# Patient Record
Sex: Male | Born: 1965 | Race: White | Hispanic: No | Marital: Married | Smoking: Never smoker
Health system: Southern US, Community
[De-identification: ages and names within clinical notes are randomized; demographics above are authoritative.]

## PROBLEM LIST (undated history)

## (undated) DIAGNOSIS — M109 Gout, unspecified: Secondary | ICD-10-CM

## (undated) DIAGNOSIS — Z87442 Personal history of urinary calculi: Secondary | ICD-10-CM

## (undated) DIAGNOSIS — E119 Type 2 diabetes mellitus without complications: Secondary | ICD-10-CM

## (undated) DIAGNOSIS — I10 Essential (primary) hypertension: Secondary | ICD-10-CM

## (undated) HISTORY — PX: LAMINECTOMY: SHX219

## (undated) HISTORY — PX: BACK SURGERY: SHX140

---

## 2001-02-17 ENCOUNTER — Encounter: Payer: Self-pay | Admitting: Internal Medicine

## 2001-02-17 ENCOUNTER — Ambulatory Visit (HOSPITAL_COMMUNITY): Admission: RE | Admit: 2001-02-17 | Discharge: 2001-02-17 | Payer: Self-pay | Admitting: Internal Medicine

## 2001-10-09 ENCOUNTER — Ambulatory Visit (HOSPITAL_COMMUNITY): Admission: RE | Admit: 2001-10-09 | Discharge: 2001-10-09 | Payer: Self-pay | Admitting: Internal Medicine

## 2001-10-09 ENCOUNTER — Encounter: Payer: Self-pay | Admitting: Internal Medicine

## 2001-10-20 ENCOUNTER — Encounter: Payer: Self-pay | Admitting: Neurosurgery

## 2001-10-23 ENCOUNTER — Encounter: Payer: Self-pay | Admitting: Neurosurgery

## 2001-10-23 ENCOUNTER — Ambulatory Visit (HOSPITAL_COMMUNITY): Admission: RE | Admit: 2001-10-23 | Discharge: 2001-10-23 | Payer: Self-pay | Admitting: Neurosurgery

## 2002-02-08 ENCOUNTER — Encounter: Admission: RE | Admit: 2002-02-08 | Discharge: 2002-05-09 | Payer: Self-pay | Admitting: Internal Medicine

## 2012-02-05 ENCOUNTER — Inpatient Hospital Stay (HOSPITAL_COMMUNITY)
Admission: EM | Admit: 2012-02-05 | Discharge: 2012-02-08 | DRG: 219 | Disposition: A | Discharge status: Home / Self Care | Payer: BC Managed Care – PPO | Attending: Orthopedic Surgery | Admitting: Orthopedic Surgery

## 2012-02-05 ENCOUNTER — Emergency Department (HOSPITAL_COMMUNITY): Payer: BC Managed Care – PPO

## 2012-02-05 ENCOUNTER — Encounter (HOSPITAL_COMMUNITY): Payer: Self-pay | Admitting: *Deleted

## 2012-02-05 DIAGNOSIS — Y92009 Unspecified place in unspecified non-institutional (private) residence as the place of occurrence of the external cause: Secondary | ICD-10-CM

## 2012-02-05 DIAGNOSIS — X500XXA Overexertion from strenuous movement or load, initial encounter: Secondary | ICD-10-CM | POA: Diagnosis present

## 2012-02-05 DIAGNOSIS — M109 Gout, unspecified: Secondary | ICD-10-CM | POA: Diagnosis present

## 2012-02-05 DIAGNOSIS — E119 Type 2 diabetes mellitus without complications: Secondary | ICD-10-CM | POA: Diagnosis present

## 2012-02-05 DIAGNOSIS — Y998 Other external cause status: Secondary | ICD-10-CM

## 2012-02-05 DIAGNOSIS — F17200 Nicotine dependence, unspecified, uncomplicated: Secondary | ICD-10-CM | POA: Diagnosis present

## 2012-02-05 DIAGNOSIS — S82853A Displaced trimalleolar fracture of unspecified lower leg, initial encounter for closed fracture: Principal | ICD-10-CM | POA: Diagnosis present

## 2012-02-05 HISTORY — DX: Type 2 diabetes mellitus without complications: E11.9

## 2012-02-05 HISTORY — DX: Gout, unspecified: M10.9

## 2012-02-05 LAB — CBC WITH DIFFERENTIAL/PLATELET
Basophils Absolute: 0.1 10*3/uL (ref 0.0–0.1)
Basophils Relative: 0 % (ref 0–1)
Eosinophils Absolute: 0.1 10*3/uL (ref 0.0–0.7)
MCH: 30.6 pg (ref 26.0–34.0)
MCHC: 35 g/dL (ref 30.0–36.0)
Neutro Abs: 11.6 10*3/uL — ABNORMAL HIGH (ref 1.7–7.7)
Neutrophils Relative %: 78 % — ABNORMAL HIGH (ref 43–77)
RDW: 13.5 % (ref 11.5–15.5)

## 2012-02-05 LAB — GLUCOSE, CAPILLARY
Glucose-Capillary: 113 mg/dL — ABNORMAL HIGH (ref 70–99)
Glucose-Capillary: 137 mg/dL — ABNORMAL HIGH (ref 70–99)

## 2012-02-05 LAB — BASIC METABOLIC PANEL
Chloride: 99 mEq/L (ref 96–112)
Creatinine, Ser: 0.96 mg/dL (ref 0.50–1.35)
GFR calc Af Amer: >90 mL/min (ref >90)
GFR calc non Af Amer: >90 mL/min (ref >90)
Potassium: 3.9 mEq/L (ref 3.5–5.1)

## 2012-02-05 LAB — PROTIME-INR: Prothrombin Time: 13.3 seconds (ref 11.6–15.2)

## 2012-02-05 MED ORDER — OXYCODONE-ACETAMINOPHEN 5-325 MG PO TABS
1.0000 | ORAL_TABLET | Freq: Once | ORAL | Status: AC
  Administered 2012-02-05: 1 via ORAL
  Filled 2012-02-05: qty 1

## 2012-02-05 MED ORDER — FENTANYL CITRATE 0.05 MG/ML IJ SOLN
50.0000 ug | Freq: Once | INTRAMUSCULAR | Status: DC
  Filled 2012-02-05: qty 2

## 2012-02-05 MED ORDER — HYDROCODONE-ACETAMINOPHEN 5-325 MG PO TABS
1.0000 | ORAL_TABLET | ORAL | Status: DC | PRN
  Administered 2012-02-07 – 2012-02-08 (×5): 1 via ORAL
  Filled 2012-02-05 (×5): qty 1

## 2012-02-05 MED ORDER — OXYCODONE-ACETAMINOPHEN 5-325 MG PO TABS
1.0000 | ORAL_TABLET | ORAL | Status: DC | PRN
  Administered 2012-02-06 – 2012-02-07 (×3): 1 via ORAL
  Filled 2012-02-05 (×4): qty 1

## 2012-02-05 MED ORDER — CEFAZOLIN SODIUM-DEXTROSE 2-3 GM-% IV SOLR
2.0000 g | Freq: Once | INTRAVENOUS | Status: DC
  Filled 2012-02-05: qty 50

## 2012-02-05 MED ORDER — HYDROMORPHONE HCL PF 1 MG/ML IJ SOLN
0.5000 mg | INTRAMUSCULAR | Status: DC | PRN
  Administered 2012-02-05 – 2012-02-08 (×8): 1 mg via INTRAVENOUS
  Filled 2012-02-05 (×8): qty 1

## 2012-02-05 MED ORDER — SODIUM CHLORIDE 0.9 % IV SOLN
INTRAVENOUS | Status: DC
  Administered 2012-02-05: 20:00:00 via INTRAVENOUS
  Administered 2012-02-06 (×2): 75 mL/h via INTRAVENOUS

## 2012-02-05 MED ORDER — SODIUM CHLORIDE 0.9 % IV SOLN: INTRAVENOUS | Status: DC

## 2012-02-05 MED ORDER — ONDANSETRON HCL 4 MG/2ML IJ SOLN: 4.0000 mg | Freq: Four times a day (QID) | INTRAMUSCULAR | Status: DC | PRN

## 2012-02-05 MED ORDER — CHLORHEXIDINE GLUCONATE 4 % EX LIQD
60.0000 mL | Freq: Once | CUTANEOUS | Status: DC
  Filled 2012-02-05 (×2): qty 15

## 2012-02-05 NOTE — ED Notes (Signed)
Pt missed last step Pt states he heard a crunch when injury occurred. Swelling to right medical ankle.

## 2012-02-05 NOTE — H&P (Signed)
Matthew Cook is an 46 y.o. male.   Chief Complaint: Right ankle trimalleolar fracture HPI: 46 year old male was walking into his home to go to the swimming pool and he fell and slipped and broke his ankle. He complains of minimal nonradiating right ankle pain which started today after the fall. Mild deformity noted. X-rays were done in the ER. Splint was applied. He is a tobacco farmer.  Past Medical History  Diagnosis Date  . Gout   . Diabetes mellitus     Past Surgical History  Procedure Date  . Back surgery   . Laminectomy     History reviewed. No pertinent family history. Social History:  reports that he has never smoked. His smokeless tobacco use includes Chew. He reports that he does not drink alcohol or use illicit drugs.  Allergies:  Allergies  Allergen Reactions  . Ciprofloxacin     Medications Prior to Admission  Medication Sig Dispense Refill  . allopurinol (ZYLOPRIM) 300 MG tablet Take 300 mg by mouth daily.        Results for orders placed during the hospital encounter of 02/05/12 (from the past 48 hour(s))  CBC WITH DIFFERENTIAL     Status: Abnormal   Collection Time   02/05/12  3:40 PM      Component Value Range Comment   WBC 14.9 (*) 4.0 - 10.5 K/uL    RBC 5.62  4.22 - 5.81 MIL/uL    Hemoglobin 17.2 (*) 13.0 - 17.0 g/dL    HCT 10.2  72.5 - 36.6 %    MCV 87.4  78.0 - 100.0 fL    MCH 30.6  26.0 - 34.0 pg    MCHC 35.0  30.0 - 36.0 g/dL    RDW 44.0  34.7 - 42.5 %    Platelets 200  150 - 400 K/uL    Neutrophils Relative 78 (*) 43 - 77 %    Neutro Abs 11.6 (*) 1.7 - 7.7 K/uL    Lymphocytes Relative 14  12 - 46 %    Lymphs Abs 2.1  0.7 - 4.0 K/uL    Monocytes Relative 7  3 - 12 %    Monocytes Absolute 1.0  0.1 - 1.0 K/uL    Eosinophils Relative 1  0 - 5 %    Eosinophils Absolute 0.1  0.0 - 0.7 K/uL    Basophils Relative 0  0 - 1 %    Basophils Absolute 0.1  0.0 - 0.1 K/uL   BASIC METABOLIC PANEL     Status: Abnormal   Collection Time   02/05/12  3:40 PM        Component Value Range Comment   Sodium 136  135 - 145 mEq/L    Potassium 3.9  3.5 - 5.1 mEq/L    Chloride 99  96 - 112 mEq/L    CO2 28  19 - 32 mEq/L    Glucose, Bld 113 (*) 70 - 99 mg/dL    BUN 14  6 - 23 mg/dL    Creatinine, Ser 9.56  0.50 - 1.35 mg/dL    Calcium 9.6  8.4 - 38.7 mg/dL    GFR calc non Af Amer >90  >90 mL/min    GFR calc Af Amer >90  >90 mL/min   PROTIME-INR     Status: Normal   Collection Time   02/05/12  3:40 PM      Component Value Range Comment   Prothrombin Time 13.3  11.6 - 15.2 seconds  INR 0.99  0.00 - 1.49   GLUCOSE, CAPILLARY     Status: Abnormal   Collection Time   02/05/12  6:07 PM      Component Value Range Comment   Glucose-Capillary 137 (*) 70 - 99 mg/dL    Dg Ankle Complete Right  02/05/2012  *RADIOLOGY REPORT*  Clinical Data: Larey Seat and injured right ankle.  RIGHT ANKLE - COMPLETE 3+ VIEW  Comparison: None.  Findings: Comminuted oblique fracture involving the distal fibula. Avulsion fragments adjacent to the tip of the lateral malleolus. Transverse fracture involving the medial malleolus.  Lateral subluxation of the talus relative to the tibia.  Mildly displaced posterior malleolar fracture involving the distal tibia.  Large joint effusion/hemarthrosis.  Diffuse soft tissue swelling.  IMPRESSION: Trimalleolar fracture with slight lateral subluxation of the talus relative to the tibia.  Original Report Authenticated By: Arnell Sieving, M.D.    Review of Systems  All other systems reviewed and are negative.    Blood pressure 150/82, pulse 77, temperature 97.7 F (36.5 C), temperature source Oral, resp. rate 18, height 6\' 3"  (1.905 m), weight 106.5 kg (234 lb 12.6 oz), SpO2 97.00%. Physical Exam  Musculoskeletal:       Vital signs are stable as recorded  General appearance is normal  The patient is alert and oriented x3  The patient's mood and affect are normal  Gait assessment: can not walk  The cardiovascular exam reveals normal  pulses and temperature without edema swelling. The lymphatic system is negative for palpable lymph nodes  The sensory exam is normal.  There are no pathologic reflexes.  Balance: could not assess  Exam of the right ankle splinted toes color normal  Seems to have normal feeling to soft touch  Upper extremity exam  Inspection and palpation revealed no abnormalities in the upper extremities.  Range of motion is full without contracture.  Motor exam is normal with grade 5 strength.  The joints are fully reduced without subluxation.  There is no atrophy or tremor and muscle tone is normal.  All joints are stable.   Left Lower extremity exam  Inspection and palpation revealed no tenderness or abnormality in alignment in the lower extremities. Range of motion is full.  Strength is grade 5.   all joints are stable.       radiographs show a trimalleolar right ankle fracture with ankle mortise displacement.  Assessment/Plan Closed right trimalleolar ankle fracture and a diet controlled diabetic active male who is a tobacco farmer  The plan is for open treatment internal fixation with lateral plate and screws and medial fixation as needed. The posterior malleolar fragment is less than 20% of the joint surface and does not require fixation  The patient has been made aware that he must be compliant. He is at high risk for infection and he is at risk for amputation based on his diabetes. He will be nonweightbearing for 12 weeks the first 6 and a cast the next 6 in a brace at 6 weeks we can start active range of motion exercises  Surgery we scheduled for Sunday morning.  Fuller Canada 02/05/2012, 7:42 PM

## 2012-02-05 NOTE — ED Provider Notes (Signed)
History     CSN: [id number]  Arrival date & time 02/05/12  1404   First MD Initiated Contact with Patient 02/05/12 1440      Chief Complaint  Patient presents with  . Ankle Pain     Patient is a 46 y.o. male presenting with ankle pain. The history is provided by the patient.  Ankle Pain  Incident onset: just prior to arrival. The incident occurred at home. Injury mechanism: twisting injury. Pain location: right ankle. The pain is moderate. The pain has been constant since onset. Associated symptoms include inability to bear weight. The symptoms are aggravated by bearing weight and palpation. He has tried rest for the symptoms. The treatment provided mild relief.  pt reports missing a step and twisting ankle No other injury reported.  Did not hit head.  No cp/headache/neck/back pain   Past Medical History  Diagnosis Date  . Gout   . Diabetes mellitus     Past Surgical History  Procedure Date  . Back surgery   . Laminectomy     History reviewed. No pertinent family history.  History  Substance Use Topics  . Smoking status: Never Smoker   . Smokeless tobacco: Not on file  . Alcohol Use: No      Review of Systems  Constitutional: Negative for fever.  Respiratory: Negative for shortness of breath.   Cardiovascular: Negative for chest pain.  Gastrointestinal: Negative for abdominal pain.  Musculoskeletal: Positive for joint swelling. Negative for back pain.  Neurological: Negative for weakness.  All other systems reviewed and are negative.    Allergies  Ciprofloxacin  Home Medications  No current outpatient prescriptions on file.  BP 141/90  Pulse 83  Temp 98.5 F (36.9 C) (Oral)  Resp 16  SpO2 98%  Physical Exam CONSTITUTIONAL: Well developed/well nourished HEAD AND FACE: Normocephalic/atraumatic EYES: EOMI/PERRL ENMT: Mucous membranes moist NECK: supple no meningeal signs SPINE:entire spine nontender CV: S1/S2 noted, no murmurs/rubs/gallops  noted LUNGS: Lungs are clear to auscultation bilaterally, no apparent distress ABDOMEN: soft, nontender, no rebound or guarding GU:no cva tenderness NEURO: Pt is awake/alert, moves all extremitiesx4 EXTREMITIES: pulses normal/equal distally Tenderness/edema/bruising noted over right malleolar surface.  No lacerations noted.  No right prox fib tenderness.  Distal pulses intact on right foot.   All other extremities/joints palpated/ranged and nontender SKIN: warm, color normal PSYCH: no abnormalities of mood noted  ED Course  Procedures   Labs Reviewed - No data to display Dg Ankle Complete Right  02/05/2012  *RADIOLOGY REPORT*  Clinical Data: Larey Seat and injured right ankle.  RIGHT ANKLE - COMPLETE 3+ VIEW  Comparison: None.  Findings: Comminuted oblique fracture involving the distal fibula. Avulsion fragments adjacent to the tip of the lateral malleolus. Transverse fracture involving the medial malleolus.  Lateral subluxation of the talus relative to the tibia.  Mildly displaced posterior malleolar fracture involving the distal tibia.  Large joint effusion/hemarthrosis.  Diffuse soft tissue swelling.  IMPRESSION: Trimalleolar fracture with slight lateral subluxation of the talus relative to the tibia.  Original Report Authenticated By: Arnell Sieving, M.D.     1. Trimalleolar fracture       MDM  Nursing notes including past medical history and social history reviewed and considered in documentation xrays reviewed and considered  Pt with closed trimalleolar fx.  Otherwise well, stable at this time Splint ordered D/w dr Romeo Apple, will admit to hospital Pt agreeable        Joya Gaskins, MD 02/05/12 [phone]

## 2012-02-05 NOTE — ED Notes (Signed)
Floor unable to take report at this time.

## 2012-02-06 ENCOUNTER — Inpatient Hospital Stay (HOSPITAL_COMMUNITY): Payer: BC Managed Care – PPO

## 2012-02-06 ENCOUNTER — Encounter (HOSPITAL_COMMUNITY)
Admission: EM | Disposition: A | Discharge status: Home / Self Care | Payer: Self-pay | Source: Home / Self Care | Attending: Orthopedic Surgery

## 2012-02-06 ENCOUNTER — Inpatient Hospital Stay (HOSPITAL_COMMUNITY): Payer: BC Managed Care – PPO | Admitting: Anesthesiology

## 2012-02-06 ENCOUNTER — Encounter (HOSPITAL_COMMUNITY): Payer: Self-pay | Admitting: Anesthesiology

## 2012-02-06 DIAGNOSIS — S82853A Displaced trimalleolar fracture of unspecified lower leg, initial encounter for closed fracture: Principal | ICD-10-CM

## 2012-02-06 HISTORY — PX: ORIF ANKLE FRACTURE: SHX5408

## 2012-02-06 LAB — GLUCOSE, CAPILLARY
Glucose-Capillary: 98 mg/dL (ref 70–99)
Glucose-Capillary: 75 mg/dL (ref 70–99)
Glucose-Capillary: 156 mg/dL — ABNORMAL HIGH (ref 70–99)

## 2012-02-06 SURGERY — OPEN REDUCTION INTERNAL FIXATION (ORIF) ANKLE FRACTURE
Anesthesia: General | Site: Ankle | Laterality: Right | Wound class: Clean

## 2012-02-06 MED ORDER — LIDOCAINE HCL (PF) 1 % IJ SOLN
INTRAMUSCULAR | Status: AC
  Filled 2012-02-06: qty 5

## 2012-02-06 MED ORDER — GLYCOPYRROLATE 0.2 MG/ML IJ SOLN
INTRAMUSCULAR | Status: AC
  Filled 2012-02-06: qty 1

## 2012-02-06 MED ORDER — LIDOCAINE HCL 1 % IJ SOLN
INTRAMUSCULAR | Status: DC | PRN
  Administered 2012-02-06: 50 mg via INTRADERMAL

## 2012-02-06 MED ORDER — BUPIVACAINE-EPINEPHRINE PF 0.5-1:200000 % IJ SOLN
INTRAMUSCULAR | Status: AC
  Filled 2012-02-06: qty 20

## 2012-02-06 MED ORDER — PHENOL 1.4 % MT LIQD: 1.0000 | OROMUCOSAL | Status: DC | PRN

## 2012-02-06 MED ORDER — FENTANYL CITRATE 0.05 MG/ML IJ SOLN
INTRAMUSCULAR | Status: AC
  Filled 2012-02-06: qty 5

## 2012-02-06 MED ORDER — ALUM & MAG HYDROXIDE-SIMETH 200-200-20 MG/5ML PO SUSP: 30.0000 mL | ORAL | Status: DC | PRN

## 2012-02-06 MED ORDER — ACETAMINOPHEN 10 MG/ML IV SOLN
1000.0000 mg | Freq: Once | INTRAVENOUS | Status: AC
  Administered 2012-02-06: 1000 mg via INTRAVENOUS

## 2012-02-06 MED ORDER — METHOCARBAMOL 100 MG/ML IJ SOLN
500.0000 mg | Freq: Once | INTRAVENOUS | Status: AC
  Administered 2012-02-06: 500 mg via INTRAVENOUS
  Filled 2012-02-06: qty 5

## 2012-02-06 MED ORDER — ONDANSETRON HCL 4 MG/2ML IJ SOLN: 4.0000 mg | Freq: Four times a day (QID) | INTRAMUSCULAR | Status: DC | PRN

## 2012-02-06 MED ORDER — CEFAZOLIN SODIUM-DEXTROSE 2-3 GM-% IV SOLR
2.0000 g | Freq: Four times a day (QID) | INTRAVENOUS | Status: AC
  Administered 2012-02-06 (×2): 2 g via INTRAVENOUS
  Filled 2012-02-06 (×2): qty 50

## 2012-02-06 MED ORDER — PROPOFOL 10 MG/ML IV BOLUS
INTRAVENOUS | Status: DC | PRN
  Administered 2012-02-06: 160 mg via INTRAVENOUS

## 2012-02-06 MED ORDER — CEFAZOLIN SODIUM-DEXTROSE 2-3 GM-% IV SOLR: 2.0000 g | INTRAVENOUS | Status: DC

## 2012-02-06 MED ORDER — ONDANSETRON HCL 4 MG/2ML IJ SOLN
INTRAMUSCULAR | Status: AC
  Administered 2012-02-06: 4 mg via INTRAVENOUS
  Filled 2012-02-06: qty 2

## 2012-02-06 MED ORDER — FENTANYL CITRATE 0.05 MG/ML IJ SOLN
INTRAMUSCULAR | Status: DC | PRN
  Administered 2012-02-06 (×2): 50 ug via INTRAVENOUS
  Administered 2012-02-06: 100 ug via INTRAVENOUS
  Administered 2012-02-06: 150 ug via INTRAVENOUS

## 2012-02-06 MED ORDER — GLYCOPYRROLATE 0.2 MG/ML IJ SOLN
INTRAMUSCULAR | Status: DC | PRN
  Administered 2012-02-06: 0.4 mg via INTRAVENOUS
  Administered 2012-02-06: 0.2 mg via INTRAVENOUS

## 2012-02-06 MED ORDER — METOCLOPRAMIDE HCL 5 MG/ML IJ SOLN: 5.0000 mg | Freq: Three times a day (TID) | INTRAMUSCULAR | Status: DC | PRN

## 2012-02-06 MED ORDER — MIDAZOLAM HCL 2 MG/2ML IJ SOLN
INTRAMUSCULAR | Status: AC
  Filled 2012-02-06: qty 4

## 2012-02-06 MED ORDER — ONDANSETRON HCL 4 MG PO TABS: 4.0000 mg | ORAL_TABLET | Freq: Four times a day (QID) | ORAL | Status: DC | PRN

## 2012-02-06 MED ORDER — OXYCODONE HCL 5 MG PO TABS
5.0000 mg | ORAL_TABLET | Freq: Once | ORAL | Status: AC
  Administered 2012-02-06: 5 mg via ORAL

## 2012-02-06 MED ORDER — SODIUM CHLORIDE 0.9 % IV SOLN
INTRAVENOUS | Status: DC
  Administered 2012-02-06: 75 mL/h via INTRAVENOUS
  Administered 2012-02-07: 22:00:00 via INTRAVENOUS

## 2012-02-06 MED ORDER — LACTATED RINGERS IV SOLN
INTRAVENOUS | Status: DC | PRN
  Administered 2012-02-06: 12:00:00 via INTRAVENOUS

## 2012-02-06 MED ORDER — CEFAZOLIN SODIUM-DEXTROSE 2-3 GM-% IV SOLR
INTRAVENOUS | Status: AC
  Administered 2012-02-06: 2 g via INTRAVENOUS
  Filled 2012-02-06: qty 50

## 2012-02-06 MED ORDER — OXYCODONE HCL 5 MG PO TABS
ORAL_TABLET | ORAL | Status: AC
  Administered 2012-02-06: 5 mg via ORAL
  Filled 2012-02-06: qty 1

## 2012-02-06 MED ORDER — PROPOFOL 10 MG/ML IV EMUL
INTRAVENOUS | Status: AC
  Filled 2012-02-06: qty 20

## 2012-02-06 MED ORDER — ALLOPURINOL 300 MG PO TABS
300.0000 mg | ORAL_TABLET | Freq: Every day | ORAL | Status: DC
  Administered 2012-02-06 – 2012-02-08 (×3): 300 mg via ORAL
  Filled 2012-02-06 (×3): qty 1

## 2012-02-06 MED ORDER — CELECOXIB 100 MG PO CAPS
400.0000 mg | ORAL_CAPSULE | Freq: Once | ORAL | Status: AC
  Administered 2012-02-06: 400 mg via ORAL

## 2012-02-06 MED ORDER — ONDANSETRON HCL 4 MG/2ML IJ SOLN
INTRAMUSCULAR | Status: DC | PRN
  Administered 2012-02-06: 4 mg via INTRAVENOUS

## 2012-02-06 MED ORDER — NEOSTIGMINE METHYLSULFATE 1 MG/ML IJ SOLN
INTRAMUSCULAR | Status: DC | PRN
  Administered 2012-02-06: 3 mg via INTRAVENOUS

## 2012-02-06 MED ORDER — ROCURONIUM BROMIDE 50 MG/5ML IV SOLN
INTRAVENOUS | Status: AC
  Filled 2012-02-06: qty 1

## 2012-02-06 MED ORDER — MIDAZOLAM HCL 5 MG/5ML IJ SOLN
INTRAMUSCULAR | Status: DC | PRN
  Administered 2012-02-06 (×2): 2 mg via INTRAVENOUS

## 2012-02-06 MED ORDER — ROCURONIUM BROMIDE 100 MG/10ML IV SOLN
INTRAVENOUS | Status: DC | PRN
  Administered 2012-02-06: 40 mg via INTRAVENOUS
  Administered 2012-02-06: 10 mg via INTRAVENOUS

## 2012-02-06 MED ORDER — ONDANSETRON HCL 4 MG/2ML IJ SOLN
INTRAMUSCULAR | Status: AC
  Filled 2012-02-06: qty 2

## 2012-02-06 MED ORDER — ONDANSETRON HCL 4 MG/2ML IJ SOLN
4.0000 mg | Freq: Once | INTRAMUSCULAR | Status: AC
  Administered 2012-02-06: 4 mg via INTRAVENOUS

## 2012-02-06 MED ORDER — ENOXAPARIN SODIUM 30 MG/0.3ML ~~LOC~~ SOLN
30.0000 mg | Freq: Two times a day (BID) | SUBCUTANEOUS | Status: DC
  Administered 2012-02-07 – 2012-02-08 (×3): 30 mg via SUBCUTANEOUS
  Filled 2012-02-06 (×3): qty 0.3

## 2012-02-06 MED ORDER — METOCLOPRAMIDE HCL 10 MG PO TABS: 5.0000 mg | ORAL_TABLET | Freq: Three times a day (TID) | ORAL | Status: DC | PRN

## 2012-02-06 MED ORDER — SODIUM CHLORIDE 0.9 % IR SOLN
Status: DC | PRN
  Administered 2012-02-06: 1000 mL

## 2012-02-06 MED ORDER — MENTHOL 3 MG MT LOZG: 1.0000 | LOZENGE | OROMUCOSAL | Status: DC | PRN

## 2012-02-06 MED ORDER — ACETAMINOPHEN 10 MG/ML IV SOLN
INTRAVENOUS | Status: AC
  Filled 2012-02-06: qty 100

## 2012-02-06 MED ORDER — CELECOXIB 100 MG PO CAPS
ORAL_CAPSULE | ORAL | Status: AC
  Administered 2012-02-06: 400 mg via ORAL
  Filled 2012-02-06: qty 4

## 2012-02-06 MED ORDER — CEFAZOLIN SODIUM 1-5 GM-% IV SOLN
INTRAVENOUS | Status: DC | PRN
  Administered 2012-02-06: 2 g via INTRAVENOUS

## 2012-02-06 MED ORDER — BUPIVACAINE-EPINEPHRINE PF 0.5-1:200000 % IJ SOLN
INTRAMUSCULAR | Status: DC | PRN
  Administered 2012-02-06: 30 mL

## 2012-02-06 MED ORDER — GLYCOPYRROLATE 0.2 MG/ML IJ SOLN
INTRAMUSCULAR | Status: AC
  Filled 2012-02-06: qty 2

## 2012-02-06 SURGICAL SUPPLY — 69 items
BAG HAMPER (MISCELLANEOUS) ×2 IMPLANT
BANDAGE ELASTIC 3 VELCRO NS (GAUZE/BANDAGES/DRESSINGS) ×2 IMPLANT
BANDAGE ELASTIC 4 VELCRO NS (GAUZE/BANDAGES/DRESSINGS) ×4 IMPLANT
BANDAGE ELASTIC 6 VELCRO NS (GAUZE/BANDAGES/DRESSINGS) ×4 IMPLANT
BANDAGE ESMARK 4X12 BL STRL LF (DISPOSABLE) ×1 IMPLANT
BIT DRILL 2.5X110 QC LCP DISP (BIT) IMPLANT
BIT DRILL 2.5X125 (BIT) ×2 IMPLANT
BIT DRILL 2.8 (BIT)
BIT DRILL CANN QC 2.8X165 (BIT) IMPLANT
BIT DRILL QC 3.5X110 (BIT) IMPLANT
BLADE SURG SZ10 CARB STEEL (BLADE) ×2 IMPLANT
BNDG COHESIVE 4X5 TAN STRL (GAUZE/BANDAGES/DRESSINGS) ×2 IMPLANT
BNDG ESMARK 4X12 BLUE STRL LF (DISPOSABLE) ×2
CANNULATED DRILL, 4.0MM, AO COUPLING ×2 IMPLANT
CHLORAPREP W/TINT 26ML (MISCELLANEOUS) ×2 IMPLANT
CLOTH BEACON ORANGE TIMEOUT ST (SAFETY) ×2 IMPLANT
COVER LIGHT HANDLE STERIS (MISCELLANEOUS) ×4 IMPLANT
CUFF TOURNIQUET SINGLE 34IN LL (TOURNIQUET CUFF) ×2 IMPLANT
DRAPE C-ARM FOLDED MOBILE STRL (DRAPES) ×2 IMPLANT
DRAPE PROXIMA HALF (DRAPES) IMPLANT
DRILL BIT 2.8MM (BIT)
GAUZE XEROFORM 5X9 LF (GAUZE/BANDAGES/DRESSINGS) ×2 IMPLANT
GLOVE SKINSENSE NS SZ8.0 LF (GLOVE) ×1
GLOVE SKINSENSE STRL SZ8.0 LF (GLOVE) ×1 IMPLANT
GLOVE SS N UNI LF 8.5 STRL (GLOVE) ×2 IMPLANT
GOWN STRL REIN XL XLG (GOWN DISPOSABLE) ×6 IMPLANT
INST SET MINOR BONE (KITS) ×2 IMPLANT
K-WIRE 1.25 TRCR POINT 150 (WIRE)
K-WIRE 1.6X150 (WIRE)
K-WIRE 2.0X150M (WIRE)
K-WIRE ORTHOPEDIC 1.4X150L (WIRE) ×4
KIT ROOM TURNOVER APOR (KITS) ×2 IMPLANT
KWIRE 1.25 TRCR POINT 150 (WIRE) IMPLANT
KWIRE 1.6X150 (WIRE) IMPLANT
KWIRE 2.0X150M (WIRE) IMPLANT
KWIRE ORTHOPEDIC 1.4X150L (WIRE) ×2 IMPLANT
MANIFOLD NEPTUNE II (INSTRUMENTS) ×2 IMPLANT
NEEDLE HYPO 21X1.5 SAFETY (NEEDLE) ×2 IMPLANT
NEEDLE HYPO 25X1 1.5 SAFETY (NEEDLE) ×2 IMPLANT
NS IRRIG 1000ML POUR BTL (IV SOLUTION) ×2 IMPLANT
PACK BASIC LIMB (CUSTOM PROCEDURE TRAY) ×2 IMPLANT
PAD ABD 5X9 TENDERSORB (GAUZE/BANDAGES/DRESSINGS) ×4 IMPLANT
PAD ARMBOARD 7.5X6 YLW CONV (MISCELLANEOUS) ×2 IMPLANT
PAD CAST 4YDX4 CTTN HI CHSV (CAST SUPPLIES) ×2 IMPLANT
PADDING CAST COTTON 4X4 STRL (CAST SUPPLIES) ×2
PLATE FIBULA 4H (Plate) ×2 IMPLANT
SCREW BONE 18 (Screw) ×4 IMPLANT
SCREW BONE 3.5X16MM (Screw) ×2 IMPLANT
SCREW BONE 3.5X20MM (Screw) ×2 IMPLANT
SCREW LOCK 3.5X10MM (Screw) ×2 IMPLANT
SCREW LOCK 3.5X14 (Screw) ×4 IMPLANT
SCREW LOCKING 3.5X16MM (Screw) ×2 IMPLANT
SCREW LOCKING 3.5X18MM (Screw) ×2 IMPLANT
SCREW PART THR 4.0X38MM (Screw) ×4 IMPLANT
SET BASIN LINEN APH (SET/KITS/TRAYS/PACK) ×2 IMPLANT
SPLINT IMMOBILIZER J 3INX20FT (CAST SUPPLIES)
SPLINT J IMMOBILIZER 3X20FT (CAST SUPPLIES) IMPLANT
SPLINT J IMMOBILIZER 4X20FT (CAST SUPPLIES) ×1 IMPLANT
SPLINT J PLASTER J 4INX20Y (CAST SUPPLIES) ×1
SPONGE GAUZE 4X4 12PLY (GAUZE/BANDAGES/DRESSINGS) ×2 IMPLANT
SPONGE LAP 18X18 X RAY DECT (DISPOSABLE) ×2 IMPLANT
STAPLER VISISTAT 35W (STAPLE) ×2 IMPLANT
SUT ETHILON 3 0 FSL (SUTURE) IMPLANT
SUT MON AB 0 CT1 (SUTURE) ×2 IMPLANT
SUT MON AB 2-0 CT1 36 (SUTURE) ×2 IMPLANT
SYR 30ML LL (SYRINGE) ×2 IMPLANT
SYR BULB IRRIGATION 50ML (SYRINGE) ×2 IMPLANT
SYR CONTROL 10ML LL (SYRINGE) ×2 IMPLANT
WASHER ASNIS III 4.0 SCR (Washer) ×4 IMPLANT

## 2012-02-06 NOTE — Brief Op Note (Addendum)
02/05/2012 - 02/06/2012  1:46 PM  PATIENT:  Matthew Cook  46 y.o. male  PRE-OPERATIVE DIAGNOSIS:  trimalleolar right ankle fracture  POST-OPERATIVE DIAGNOSIS:  trimalleolar right ankle fracture  PROCEDURE:  Procedure(s) (LRB): OPEN REDUCTION INTERNAL FIXATION (ORIF) ANKLE FRACTURE (Right)  Stryker plate and 4.0 cannulated screws medial   SURGEON:  Surgeon(s) and Role:    * Vickki Hearing, MD - Primary  PHYSICIAN ASSISTANT:   ASSISTANTS: none   ANESTHESIA:   general  EBL:  Total I/O In: 1500 [I.V.:1500] Out: 50 [Blood:50]  BLOOD ADMINISTERED:none  DRAINS: none   LOCAL MEDICATIONS USED:  MARCAINE   , Amount: 30l and OTHER epi SPECIMEN:  No Specimen  DISPOSITION OF SPECIMEN:  N/A  COUNTS:  YES  TOURNIQUET:   Total Tourniquet Time Documented: Thigh (Right) - 75 minutes  DICTATION: .Dragon Dictation  PLAN OF CARE: Admit to inpatient   PATIENT DISPOSITION:  PACU - hemodynamically stable.   Delay start of Pharmacological VTE agent (>24hrs) due to surgical blood loss or risk of bleeding: no

## 2012-02-06 NOTE — Anesthesia Preprocedure Evaluation (Signed)
Anesthesia Evaluation  Patient identified by MRN, date of birth, ID band Patient awake    Reviewed: Allergy & Precautions, H&P , NPO status , Patient's Chart, lab work & pertinent test results  Airway Mallampati: I TM Distance: >3 FB Neck ROM: Full    Dental  (+) Teeth Intact and Caps,    Pulmonary neg pulmonary ROS,          Cardiovascular Exercise Tolerance: Good Rhythm:Regular Rate:Normal     Neuro/Psych    GI/Hepatic negative GI ROS,   Endo/Other  Well Controlled, Type 2  Renal/GU      Musculoskeletal   Abdominal (+)  Abdomen: soft. Bowel sounds: normal.  Peds  Hematology   Anesthesia Other Findings Hx : gout and kidney stones  Reproductive/Obstetrics                           Anesthesia Physical Anesthesia Plan  ASA: I  Anesthesia Plan: General   Post-op Pain Management:    Induction: Intravenous  Airway Management Planned: Oral ETT  Additional Equipment:   Intra-op Plan:   Post-operative Plan: Extubation in OR  Informed Consent: I have reviewed the patients History and Physical, chart, labs and discussed the procedure including the risks, benefits and alternatives for the proposed anesthesia with the patient or authorized representative who has indicated his/her understanding and acceptance.     Plan Discussed with: Anesthesiologist  Anesthesia Plan Comments:         Anesthesia Quick Evaluation

## 2012-02-06 NOTE — Transfer of Care (Signed)
Immediate Anesthesia Transfer of Care Note  Patient: Matthew Cook  Procedure(s) Performed: Procedure(s) (LRB): OPEN REDUCTION INTERNAL FIXATION (ORIF) ANKLE FRACTURE (Right)  Patient Location: PACU  Anesthesia Type: General  Level of Consciousness: awake and patient cooperative  Airway & Oxygen Therapy: Patient Spontanous Breathing and Patient connected to face mask oxygen  Post-op Assessment: Report given to PACU RN, Post -op Vital signs reviewed and stable and Patient moving all extremities  Post vital signs: Reviewed and stable  Complications: No apparent anesthesia complications

## 2012-02-06 NOTE — Anesthesia Postprocedure Evaluation (Signed)
  Anesthesia Post-op Note  Patient: Matthew Cook  Procedure(s) Performed: Procedure(s) (LRB): OPEN REDUCTION INTERNAL FIXATION (ORIF) ANKLE FRACTURE (Right)  Patient Location: PACU  Anesthesia Type: General  Level of Consciousness: awake, alert , oriented and patient cooperative  Airway and Oxygen Therapy: Patient Spontanous Breathing  Post-op Pain: 2 /10, mild  Post-op Assessment: Post-op Vital signs reviewed, Patient's Cardiovascular Status Stable, Respiratory Function Stable, Patent Airway, No signs of Nausea or vomiting and Pain level controlled  Post-op Vital Signs: Reviewed and stable  Complications: No apparent anesthesia complications

## 2012-02-06 NOTE — Anesthesia Procedure Notes (Signed)
Procedure Name: Intubation Date/Time: 02/06/2012 11:55 AM Performed by: Despina Hidden Pre-anesthesia Checklist: Suction available, Patient being monitored, Emergency Drugs available and Patient identified Patient Re-evaluated:Patient Re-evaluated prior to inductionPreoxygenation: Pre-oxygenation with 100% oxygen Intubation Type: IV induction Ventilation: Mask ventilation without difficulty Laryngoscope Size: Mac and 3 Grade View: Grade I Tube type: Oral Number of attempts: 1 Airway Equipment and Method: Stylet Placement Confirmation: positive ETCO2,  breath sounds checked- equal and bilateral and ETT inserted through vocal cords under direct vision Secured at: 22 cm Tube secured with: Tape Dental Injury: Teeth and Oropharynx as per pre-operative assessment

## 2012-02-07 LAB — GLUCOSE, CAPILLARY: Glucose-Capillary: 139 mg/dL — ABNORMAL HIGH (ref 70–99)

## 2012-02-07 NOTE — Progress Notes (Signed)
Subjective: 1 Day Post-Op Procedure(s) (LRB): OPEN REDUCTION INTERNAL FIXATION (ORIF) ANKLE FRACTURE (Right) Patient reports pain as mild.    Objective: Vital signs in last 24 hours: Temp:  [97.2 F (36.2 C)-98.4 F (36.9 C)] 97.5 F (36.4 C) (07/08 0437) Pulse Rate:  [54-83] 70  (07/08 0437) Resp:  [13-20] 20  (07/08 0437) BP: (114-141)/(66-94) 119/69 mmHg (07/08 0437) SpO2:  [95 %-100 %] 96 % (07/08 0437)  Intake/Output from previous day: 07/07 0701 - 07/08 0700 In: 2768.8 [P.O.:100; I.V.:2468.8; IV Piggyback:200] Out: 900 [Urine:850; Blood:50] Intake/Output this shift:     Basename 02/05/12 1540  HGB 17.2*    Basename 02/05/12 1540  WBC 14.9*  RBC 5.62  HCT 49.1  PLT 200    Basename 02/05/12 1540  NA 136  K 3.9  CL 99  CO2 28  BUN 14  CREATININE 0.96  GLUCOSE 113*  CALCIUM 9.6    Basename 02/05/12 1540  LABPT --  INR 0.99    Neurologically intact Neurovascular intact Sensation intact distally Intact pulses distally  Assessment/Plan: 1 Day Post-Op Procedure(s) (LRB): OPEN REDUCTION INTERNAL FIXATION (ORIF) ANKLE FRACTURE (Right) Up with therapy  Fuller Canada 02/07/2012, 7:51 AM

## 2012-02-07 NOTE — Addendum Note (Signed)
Addendum  created 02/07/12 1115 by Samauri Kellenberger J Bre Pecina, CRNA   Modules edited:Notes Section    

## 2012-02-07 NOTE — Evaluation (Signed)
Physical Therapy Evaluation Patient Details Name: Matthew Cook: [id number] DOB: Dec 04, 1965 Today's Date: 02/07/2012 Time: 4098-1191 PT Time Calculation (min): 22 min  PT Assessment / Plan / Recommendation Clinical Impression  Pt seen for gait training with walker, NWB R.  He is in good health and had no problem ambulating 120' with a walker.  He was instructed in gait on a step and is independent.  He has a walker at home.  No further PT should be needed.    PT Assessment  Patent does not need any further PT services    Follow Up Recommendations  No PT follow up    Barriers to Discharge        Equipment Recommendations  None recommended by PT    Recommendations for Other Services     Frequency      Precautions / Restrictions Precautions Precautions: Fall Restrictions Weight Bearing Restrictions: Yes RLE Weight Bearing: Non weight bearing   Pertinent Vitals/Pain       Mobility  Transfers Transfers: Sit to Stand;Stand to Sit Sit to Stand: 7: Independent Stand to Sit: 7: Independent Ambulation/Gait Ambulation/Gait Assistance: 6: Modified independent (Device/Increase time) Ambulation Distance (Feet): 120 Feet Assistive device: Rolling walker Gait Pattern: Within Functional Limits Stairs: Yes Stairs Assistance: 6: Modified independent (Device/Increase time) Stair Management Technique: With walker Number of Stairs: 1  Wheelchair Mobility Wheelchair Mobility: No    Exercises     PT Diagnosis:    PT Problem List:   PT Treatment Interventions:     PT Goals    Visit Information  Last PT Received On: 02/07/12    Subjective Data  Subjective: I'm ready to go home   Prior Functioning  Home Living Lives With: Spouse Available Help at Discharge: Family Type of Home: House Home Access: Level entry Home Layout: One level Home Adaptive Equipment: Environmental consultant - standard Prior Function Level of Independence: Independent Able to Take Stairs?: Yes Driving:  Yes Vocation: Full time employment Communication Communication: No difficulties    Cognition  Overall Cognitive Status: Appears within functional limits for tasks assessed/performed Arousal/Alertness: Awake/alert Orientation Level: Appears intact for tasks assessed Behavior During Session: [facility] for tasks performed    Extremity/Trunk Assessment Right Upper Extremity Assessment RUE ROM/Strength/Tone: Within functional levels Left Upper Extremity Assessment LUE ROM/Strength/Tone: Within functional levels Right Lower Extremity Assessment RLE ROM/Strength/Tone: Within functional levels Left Lower Extremity Assessment LLE ROM/Strength/Tone: Within functional levels   Balance Balance Balance Assessed: No  End of Session PT - End of Session Equipment Utilized During Treatment: Gait belt Activity Tolerance: Patient tolerated treatment well Patient left: in bed Nurse Communication: Mobility status  GP     Konrad Penta 02/07/2012, 10:13 AM

## 2012-02-07 NOTE — Op Note (Signed)
02/05/2012 - 02/06/2012  1:46 PM  PATIENT:  Matthew Cook  46 y.o. male  PRE-OPERATIVE DIAGNOSIS:  trimalleolar right ankle fracture  POST-OPERATIVE DIAGNOSIS:  trimalleolar right ankle fracture  PROCEDURE:  Procedure(s) (LRB): OPEN REDUCTION INTERNAL FIXATION (ORIF) ANKLE FRACTURE (Right)  Operative findings trimalleolar fracture right ankle  Details of procedure The right ankle was marked as a surgical site and confirmed by chart review and physical examination. The patient was taken back to the operating room for general anesthesia.  2 sandbag placed under the right hip a tourniquet was applied to the right thigh and the right leg was prepped and draped from toes to thigh.   The timeout procedure was completed and antibiotics were started.  The limb was exsanguinated with a six-inch Esmarch and the tourniquet was elevated to 300 mm of mercury. A lateral incision was made over the fibula and taken down to bone with full-thickness skin flaps. Subperiosteal dissection expose the fracture the fracture was irrigated debrided and reduced manually. The sharp reduction clamp was used to hold the fracture reduced.  Assessment of the fracture line did not allow lag screw fixation. A precontoured fibular plate was applied to the fibula using AO technique after insertion of 2 nonlocking screws radiographs confirmed that the mortise was reduced and the fracture had been reduced. Using AO technique and manufacturer's technique a combination of locking and nonlocking screws were used to complete fixation. Radiographs confirmed reduction of the mortise and the fracture.  An incision was then made over the medial malleolus and carried down to bone with full-thickness skin flaps. A sharp reduction clamp was used to hold the medial malleolus in place. 2 K wires were placed under C-arm guidance to hold the fracture reduced these were overdrilled and then 2, 4.0 mm cannulated screws were placed.   Final  radiographs confirmed reduction and hardware placement. These were acceptable. Both wounds were irrigated and closed with 2-0 Monocryl medially followed by staples Monocryl laterally followed by staples. We then injected Marcaine with epinephrine into the wounds for postoperative anesthesia.  A sterile dressing was applied with a posterior splint with the foot in the neutral position   Stryker plate and 4.0 cannulated screws medial   SURGEON:  Surgeon(s) and Role:    * Vickki Hearing, MD - Primary  PHYSICIAN ASSISTANT:   ASSISTANTS: none   ANESTHESIA:   general  EBL:  Total I/O In: 1500 [I.V.:1500] Out: 50 [Blood:50]  BLOOD ADMINISTERED:none  DRAINS: none   LOCAL MEDICATIONS USED:  MARCAINE   , Amount: 30l and OTHER epi SPECIMEN:  No Specimen  DISPOSITION OF SPECIMEN:  N/A  COUNTS:  YES  TOURNIQUET:   Total Tourniquet Time Documented: Thigh (Right) - 75 minutes  DICTATION: .Dragon Dictation  PLAN OF CARE: Admit to inpatient   PATIENT DISPOSITION:  PACU - hemodynamically stable.   Delay start of Pharmacological VTE agent (>24hrs) due to surgical blood loss or risk of bleeding: no

## 2012-02-07 NOTE — Anesthesia Postprocedure Evaluation (Signed)
  Anesthesia Post-op Note  Patient: Matthew Cook  Procedure(s) Performed: Procedure(s) (LRB): OPEN REDUCTION INTERNAL FIXATION (ORIF) ANKLE FRACTURE (Right)  Patient Location: 335 A  Anesthesia Type: General  Level of Consciousness: awake, alert , oriented and patient cooperative  Airway and Oxygen Therapy: Patient Spontanous Breathing  Post-op Pain: 7 /10, moderate  Post-op Assessment: Post-op Vital signs reviewed, Patient's Cardiovascular Status Stable, Respiratory Function Stable, Patent Airway, No signs of Nausea or vomiting and Adequate PO intake  Post-op Vital Signs: Reviewed and stable  Complications: No apparent anesthesia complications

## 2012-02-08 ENCOUNTER — Other Ambulatory Visit: Payer: Self-pay | Admitting: Orthopedic Surgery

## 2012-02-08 DIAGNOSIS — S82851A Displaced trimalleolar fracture of right lower leg, initial encounter for closed fracture: Secondary | ICD-10-CM

## 2012-02-08 LAB — GLUCOSE, CAPILLARY: Glucose-Capillary: 118 mg/dL — ABNORMAL HIGH (ref 70–99)

## 2012-02-08 MED ORDER — OXYCODONE-ACETAMINOPHEN 5-325 MG PO TABS: 1.0000 | ORAL_TABLET | ORAL | Status: DC | PRN

## 2012-02-08 NOTE — Progress Notes (Signed)
UR Chart Review Completed  

## 2012-02-08 NOTE — Discharge Summary (Signed)
Physician Discharge Summary  Patient ID: YADER CRIGER MRN: [id number] DOB/AGE: November 11, 1965 46 y.o.  Admit date: 02/05/2012 Discharge date: 02/08/2012  Admission Diagnoses: RIGHT ANKLE TRIMALL FRACTURE , CLOSED   Discharge Diagnoses: SAME  Active Problems:  * No active hospital problems. *    Discharged Condition: good  Hospital Course: UNREMARKABLE   Consults: None  Significant Diagnostic Studies: NONE   Treatments: surgery: OTIF RIGHT ANKLE STRYKER PLATE AND SCREWS   Discharge Exam: Blood pressure 154/83, pulse 94, temperature 99.5 F (37.5 C), temperature source Oral, resp. rate 20, height 6\' 3"  (1.905 m), weight 106.5 kg (234 lb 12.6 oz), SpO2 94.00%. General appearance: alert, cooperative, appears stated age and no distress SPLINT INTACT   Disposition:   Discharge Orders    Future Orders Please Complete By Expires   Diet - low sodium heart healthy      Increase activity slowly      Driving Restrictions      Comments:   NO DRIVING   Lifting restrictions      Comments:   NO LIFTING   No wound care      Call MD for:  temperature >100.4      Call MD for:  persistant nausea and vomiting      Call MD for:  severe uncontrolled pain      Call MD for:  redness, tenderness, or signs of infection (pain, swelling, redness, odor or green/yellow discharge around incision site)        Medication List  As of 02/08/2012  8:08 AM   TAKE these medications         allopurinol 300 MG tablet   Commonly known as: ZYLOPRIM   Take 300 mg by mouth daily.      oxyCODONE-acetaminophen 5-325 MG per tablet   Commonly known as: PERCOCET   Take 1 tablet by mouth every 4 (four) hours as needed.           Follow-up Information    Follow up with Fuller Canada, MD on 02/14/2012. (CALL OFFICE FOR TIME )    Contact information:   [address] [place] [address] [phone]         NO WEIGHT BEARING  Signed: Fuller Canada 02/08/2012, 8:08 AM

## 2012-02-08 NOTE — Progress Notes (Signed)
D/C instructions, medications, and f/u appt discussed with pt. Pt verbalized understanding. D/Ced home at this time. Taken out of facility via w/c by staff.

## 2012-02-08 NOTE — Care Management Note (Signed)
    Page 1 of 1   02/08/2012     2:11:25 PM   CARE MANAGEMENT NOTE 02/08/2012  Patient:  Matthew Cook, Matthew Cook   Account Number:  [id number]  Date Initiated:  02/08/2012  Documentation initiated by:  Sharrie Rothman  Subjective/Objective Assessment:   Pt admitted from home with ankle fracture. Pt is s/p OTIF right ankle. Pt will return home at discharge. Pt is independent with ADL's.     Action/Plan:   No CM needs noted. Pt has a walker that he can use. No PT at this time.   Anticipated DC Date:  02/08/2012   Anticipated DC Plan:  HOME/SELF CARE      DC Planning Services  CM consult      Choice offered to / List presented to:             Status of service:  Completed, signed off Medicare Important Message given?   (If response is "NO", the following Medicare IM given date fields will be blank) Date Medicare IM given:   Date Additional Medicare IM given:    Discharge Disposition:  HOME/SELF CARE  Per UR Regulation:    If discussed at Long Length of Stay Meetings, dates discussed:    Comments:  02/08/12 1410 Arlyss Queen, RN BSN CM

## 2012-02-14 ENCOUNTER — Ambulatory Visit (INDEPENDENT_AMBULATORY_CARE_PROVIDER_SITE_OTHER): Payer: BC Managed Care – PPO | Admitting: Orthopedic Surgery

## 2012-02-14 ENCOUNTER — Encounter: Payer: Self-pay | Admitting: Orthopedic Surgery

## 2012-02-14 VITALS — BP 150/80 | Ht 75.0 in | Wt 234.0 lb

## 2012-02-14 DIAGNOSIS — S82899A Other fracture of unspecified lower leg, initial encounter for closed fracture: Secondary | ICD-10-CM

## 2012-02-14 MED ORDER — IBUPROFEN 800 MG PO TABS: 800.0000 mg | ORAL_TABLET | Freq: Three times a day (TID) | ORAL | Status: AC | PRN

## 2012-02-14 MED ORDER — OXYCODONE-ACETAMINOPHEN 5-325 MG PO TABS: 1.0000 | ORAL_TABLET | ORAL | Status: AC | PRN

## 2012-02-14 NOTE — Patient Instructions (Addendum)
Do 100 ankel flexion per day   Wear air cast

## 2012-02-15 NOTE — Progress Notes (Signed)
Patient ID: TAJI BARRETTO, male   DOB: 1965-09-06, 46 y.o.   MRN: [id number] Chief Complaint  Patient presents with  . Routine Post Op    post op right ankle, DOS 02/06/12    BP 150/80  Ht 6\' 3"  (1.905 m)  Wt 234 lb (106.142 kg)  BMI 29.25 kg/m2  OPEN REDUCTION INTERNAL FIXATION (ORIF) ANKLE FRACTURE (Right)  Operative findings trimalleolar fracture right ankle   Doing well as a blister on the posterior aspect of his right lateral incision but the incision line looks clean he also has a splint/cast sore on the heel superficial only. Medial side looks good. He has done a good job controlling swelling. He has 20 of ankle range of motion approximately 10 in each direction  He is placed in an Aircast continue nonweightbearing start active range of motion exercises followup for x-ray and wound care/staple suture removal in one week

## 2012-02-17 ENCOUNTER — Encounter (HOSPITAL_COMMUNITY): Payer: Self-pay | Admitting: Orthopedic Surgery

## 2012-02-21 ENCOUNTER — Encounter: Payer: Self-pay | Admitting: Orthopedic Surgery

## 2012-02-21 ENCOUNTER — Ambulatory Visit (INDEPENDENT_AMBULATORY_CARE_PROVIDER_SITE_OTHER): Payer: BC Managed Care – PPO

## 2012-02-21 ENCOUNTER — Ambulatory Visit (INDEPENDENT_AMBULATORY_CARE_PROVIDER_SITE_OTHER): Payer: BC Managed Care – PPO | Admitting: Orthopedic Surgery

## 2012-02-21 VITALS — BP 150/90 | Ht 75.0 in | Wt 234.0 lb

## 2012-02-21 DIAGNOSIS — S82899A Other fracture of unspecified lower leg, initial encounter for closed fracture: Secondary | ICD-10-CM | POA: Insufficient documentation

## 2012-02-21 NOTE — Progress Notes (Signed)
Patient ID: Matthew Cook, male   DOB: [birth year], 46 y.o.   MRN: [id number] Chief Complaint  Patient presents with  . Follow-up    1 week recheck, xray , cast right ankel, DOI 02/06/12    Wound clean   Stitches and staples out   The x-ray was taken today and the fracture is in stable position. Hardware is in good position.  A short leg nonweightbearing cast applied with cast shoe.  Followup in 4 weeks for x-ray plaster and conversion to a Cam Walker and weightbearing if x-rays look good

## 2012-02-21 NOTE — Patient Instructions (Addendum)
Keep  Cast dry   Do not get wet   If it gets wet dry with a hair dryer on low setting and call the office   No weight bearing  

## 2012-03-20 ENCOUNTER — Ambulatory Visit (INDEPENDENT_AMBULATORY_CARE_PROVIDER_SITE_OTHER): Payer: BC Managed Care – PPO

## 2012-03-20 ENCOUNTER — Ambulatory Visit (INDEPENDENT_AMBULATORY_CARE_PROVIDER_SITE_OTHER): Payer: BC Managed Care – PPO | Admitting: Orthopedic Surgery

## 2012-03-20 ENCOUNTER — Encounter: Payer: Self-pay | Admitting: Orthopedic Surgery

## 2012-03-20 VITALS — Ht 75.0 in | Wt 234.0 lb

## 2012-03-20 DIAGNOSIS — S82891A Other fracture of right lower leg, initial encounter for closed fracture: Secondary | ICD-10-CM

## 2012-03-20 DIAGNOSIS — S82899A Other fracture of unspecified lower leg, initial encounter for closed fracture: Secondary | ICD-10-CM

## 2012-03-20 NOTE — Progress Notes (Signed)
Patient ID: Matthew Cook, male   DOB: [birth year], 46 y.o.   MRN: [id number] Chief Complaint  Patient presents with  . Follow-up    4 week recheck and xray Right ankle, DOS 02/06/12    Ht 6\' 3"  (1.905 m)  Wt 234 lb (106.142 kg)  BMI 29.25 kg/m2  Postop day 43, 6 weeks 1 day  Doing well  Nonweightbearing in cast  X-rays show stable fixation, medial lateral fixation. There is a fracture line notable on lateral x-ray currently not of concern as this was a comminuted fracture fragment which was removed secondary to inability to place a interfrag screw and no soft tissue attachment.  Weightbearing as tolerated in brace Cam Walker. X-ray in 6 weeks

## 2012-03-20 NOTE — Patient Instructions (Addendum)
Cam walker WBAT   OK TO DRIVE WITH THE BRACE OFF

## 2012-03-27 ENCOUNTER — Telehealth: Payer: Self-pay | Admitting: *Deleted

## 2012-03-27 NOTE — Telephone Encounter (Signed)
Advised patient ice and elevation

## 2012-05-01 ENCOUNTER — Ambulatory Visit (INDEPENDENT_AMBULATORY_CARE_PROVIDER_SITE_OTHER): Payer: BC Managed Care – PPO

## 2012-05-01 ENCOUNTER — Encounter: Payer: Self-pay | Admitting: Orthopedic Surgery

## 2012-05-01 ENCOUNTER — Ambulatory Visit (INDEPENDENT_AMBULATORY_CARE_PROVIDER_SITE_OTHER): Payer: BC Managed Care – PPO | Admitting: Orthopedic Surgery

## 2012-05-01 VITALS — BP 138/60 | Ht 75.0 in | Wt 234.0 lb

## 2012-05-01 DIAGNOSIS — S82899A Other fracture of unspecified lower leg, initial encounter for closed fracture: Secondary | ICD-10-CM

## 2012-05-01 DIAGNOSIS — S82891A Other fracture of right lower leg, initial encounter for closed fracture: Secondary | ICD-10-CM

## 2012-05-01 NOTE — Patient Instructions (Signed)
Weight bearing as tolerated in regular regular boot

## 2012-05-02 ENCOUNTER — Encounter: Payer: Self-pay | Admitting: Orthopedic Surgery

## 2012-05-02 NOTE — Progress Notes (Signed)
Patient ID: Matthew Cook, male   DOB: 1966/04/30, 46 y.o.   MRN: [id number] Chief Complaint  Patient presents with  . Follow-up    follow up right ankle fracture, DOS 02/06/12    12 week followup doing well except for end of the day swelling. No pain  X-ray looks good today  The patient can return to normal shoe wear and normal activity follow up one month

## 2012-05-31 ENCOUNTER — Ambulatory Visit (INDEPENDENT_AMBULATORY_CARE_PROVIDER_SITE_OTHER): Payer: BC Managed Care – PPO | Admitting: Orthopedic Surgery

## 2012-05-31 ENCOUNTER — Encounter: Payer: Self-pay | Admitting: Orthopedic Surgery

## 2012-05-31 VITALS — BP 140/88 | Ht 75.0 in | Wt 234.0 lb

## 2012-05-31 DIAGNOSIS — S82843A Displaced bimalleolar fracture of unspecified lower leg, initial encounter for closed fracture: Secondary | ICD-10-CM

## 2012-05-31 NOTE — Patient Instructions (Addendum)
activities as tolerated 

## 2012-05-31 NOTE — Progress Notes (Signed)
Patient ID: Matthew Cook, male   DOB: [birth year], 46 y.o.   MRN: [id number] Chief Complaint  Patient presents with  . Follow-up    1 month recheck on right ankle fracture. 7.7.2013    Swelling complaint is returned back to his normal activities.  His ankle shows good motion normal incisions. Mild edema. Should resolve over the next year. He can follow up as needed

## 2013-10-29 IMAGING — CR DG ANKLE COMPLETE 3+V*R*
3 series · 3 of 3 positions shown · non-contrast
Comparison: None.

CLINICAL DATA: Fell and injured right ankle.

RIGHT ANKLE - COMPLETE 3+ VIEW

[view not recorded (1 of 3)]
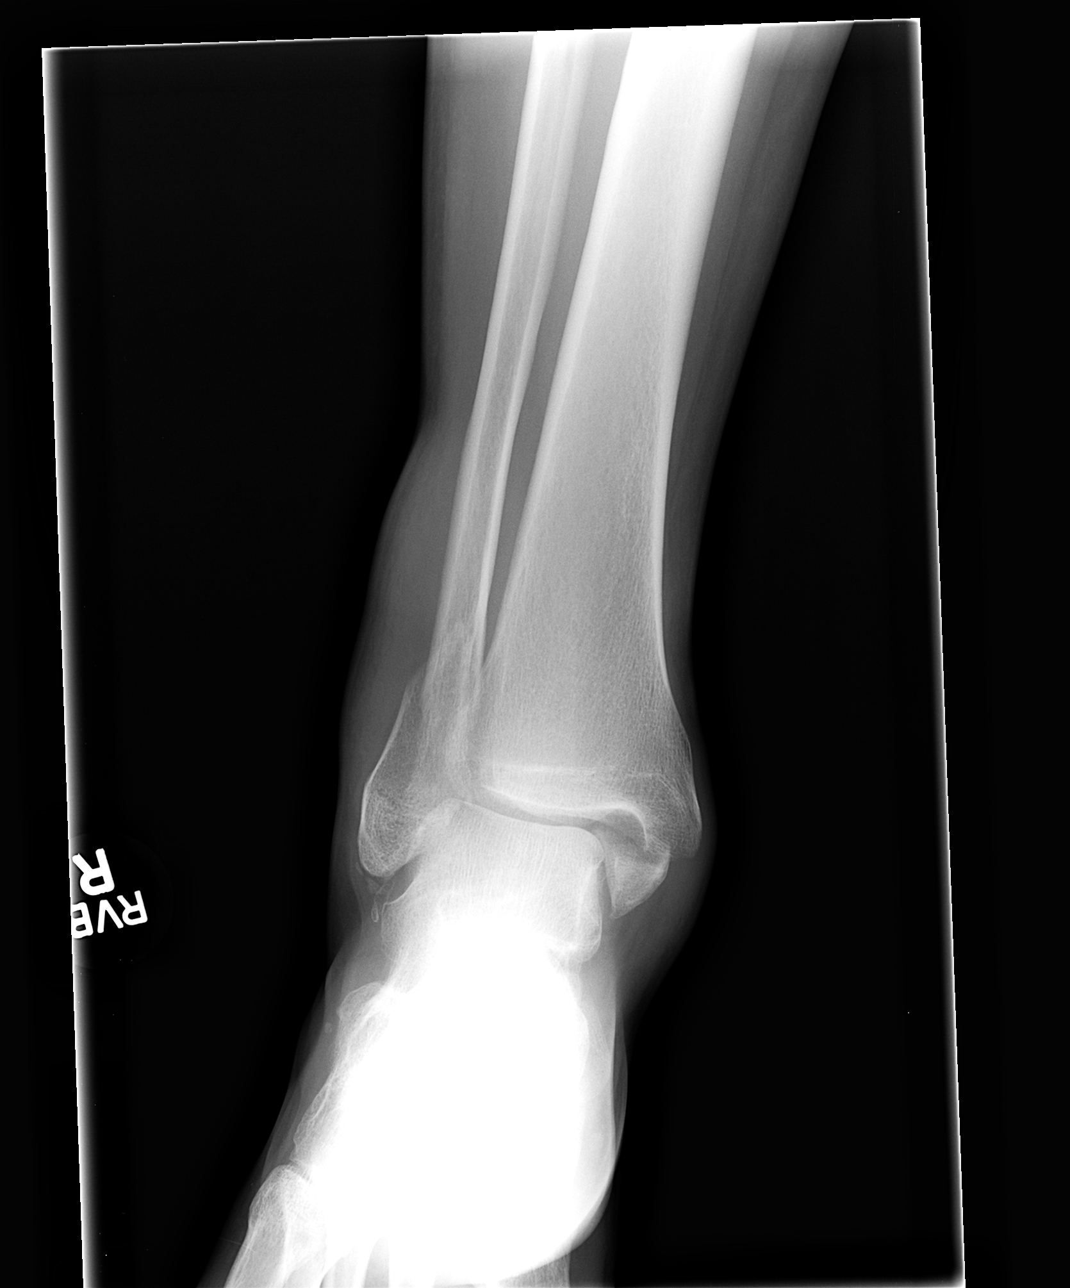

[view not recorded (2 of 3)]
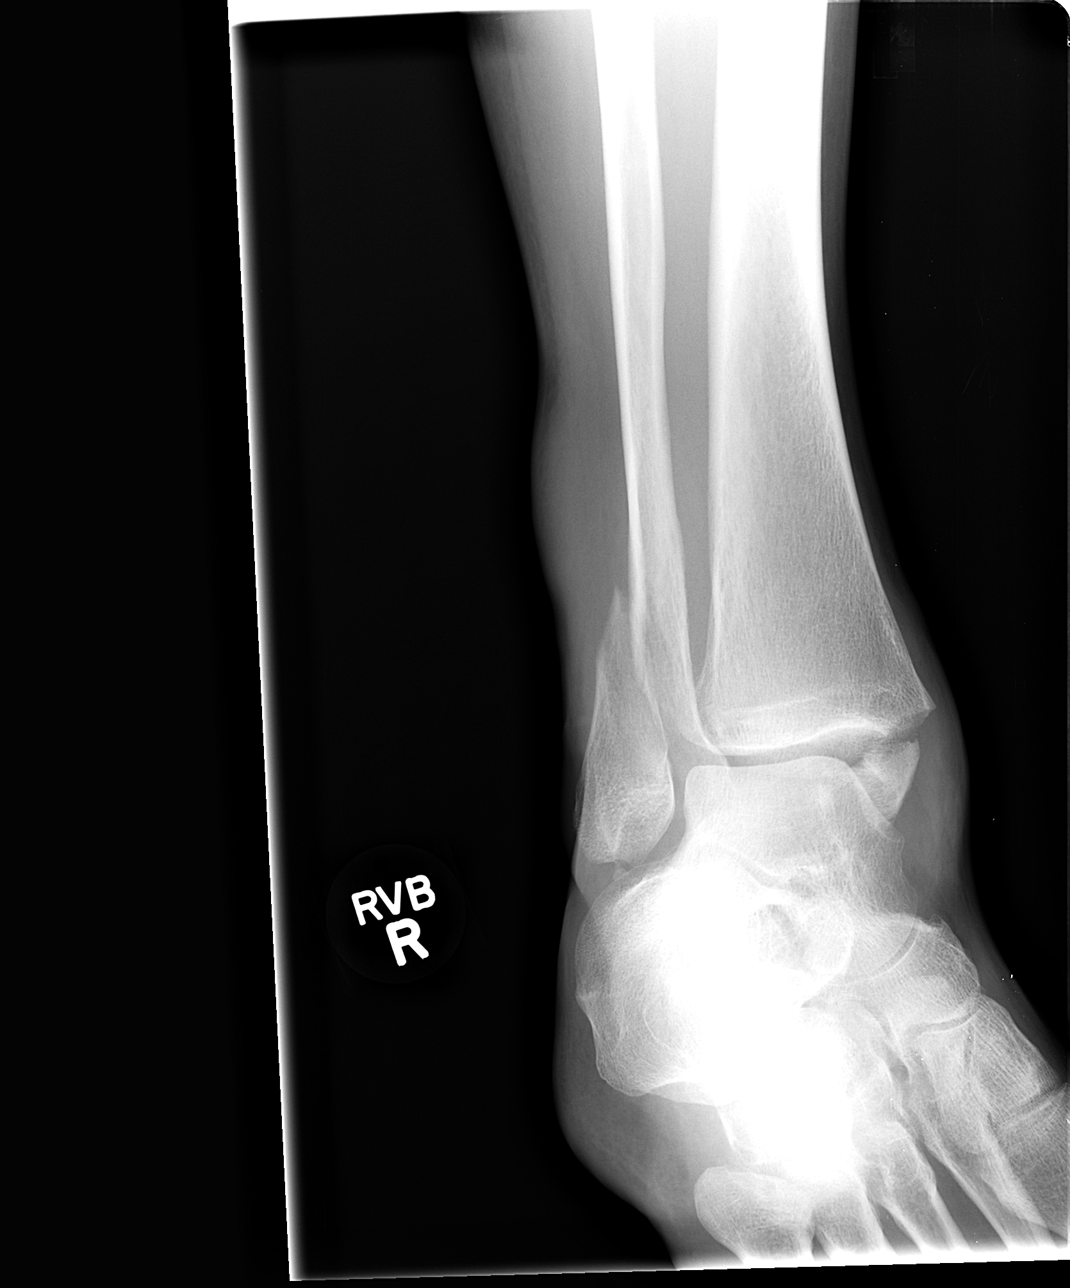

[view not recorded (3 of 3)]
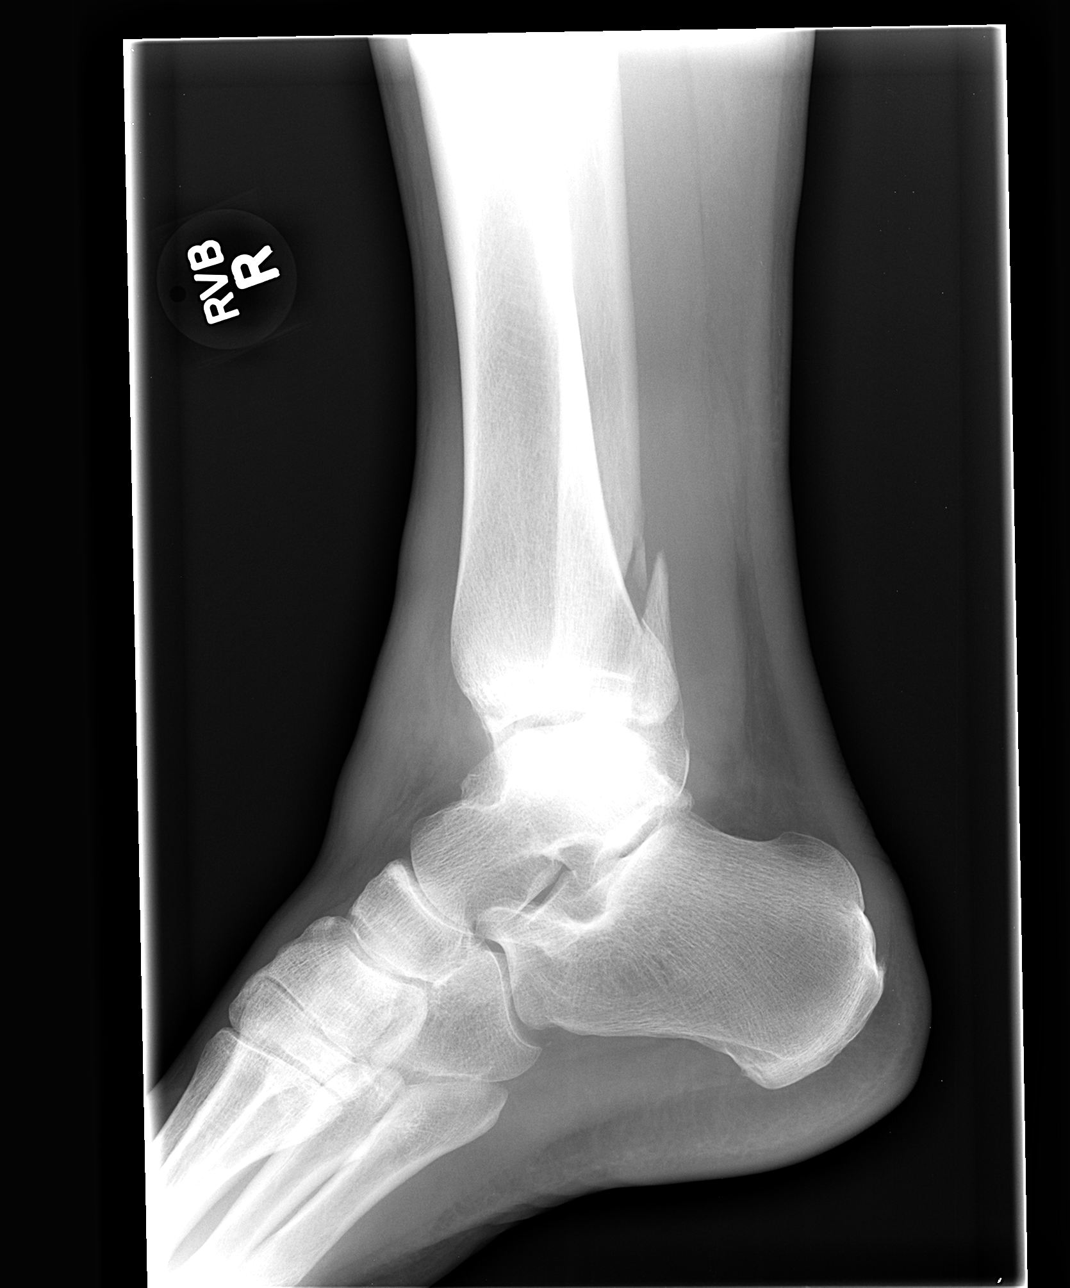

[3 of 3 positions shown; findings below may reference images not displayed]

FINDINGS: Comminuted oblique fracture involving the distal fibula.
Avulsion fragments adjacent to the tip of the lateral malleolus.
Transverse fracture involving the medial malleolus.  Lateral
subluxation of the talus relative to the tibia.  Mildly displaced
posterior malleolar fracture involving the distal tibia.  Large
joint effusion/hemarthrosis.  Diffuse soft tissue swelling.
IMPRESSION: Trimalleolar fracture with slight lateral subluxation of the talus
relative to the tibia.

## 2013-10-30 IMAGING — RF DG ANKLE 2V *R*
1 series · 5 of 5 positions shown · non-contrast
Comparison: Yesterday.

CLINICAL DATA: Operative fixation of a trimalleolar fracture.

RIGHT ANKLE - 2 VIEW

[Series 1: run · 5 of 5 slices shown]
[im 1/5]
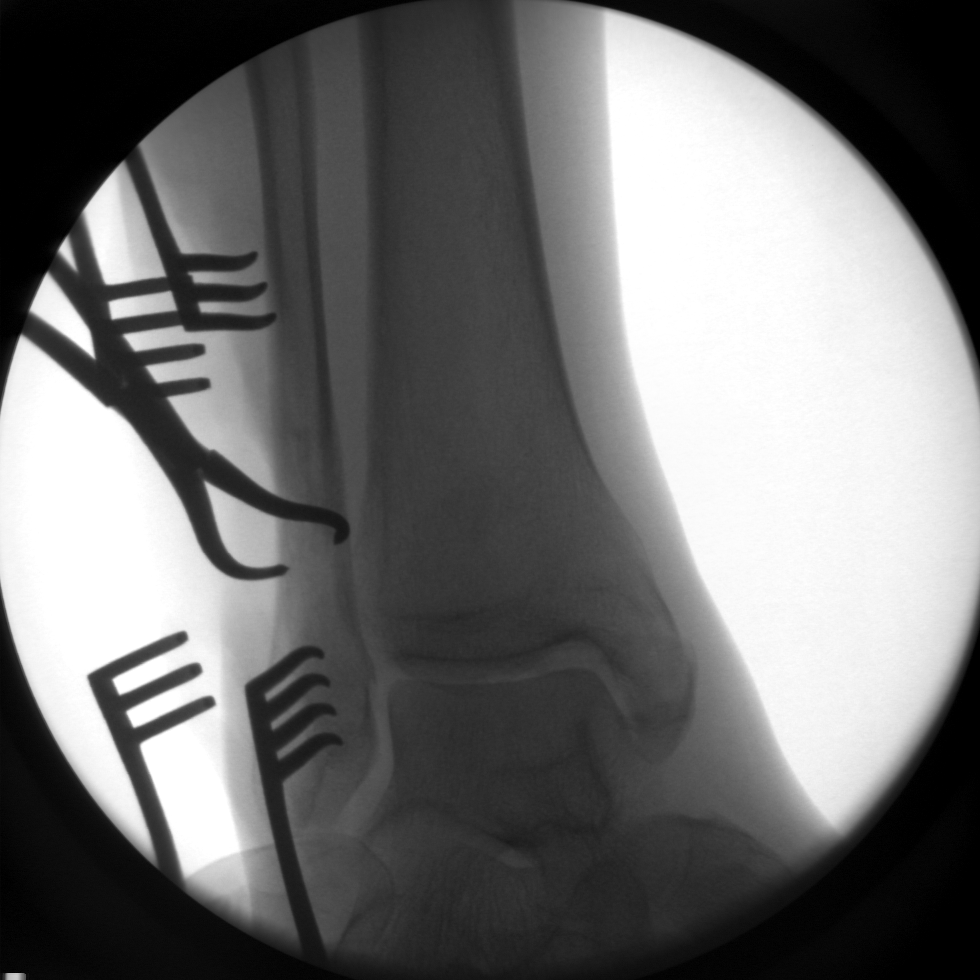
[im 2/5]
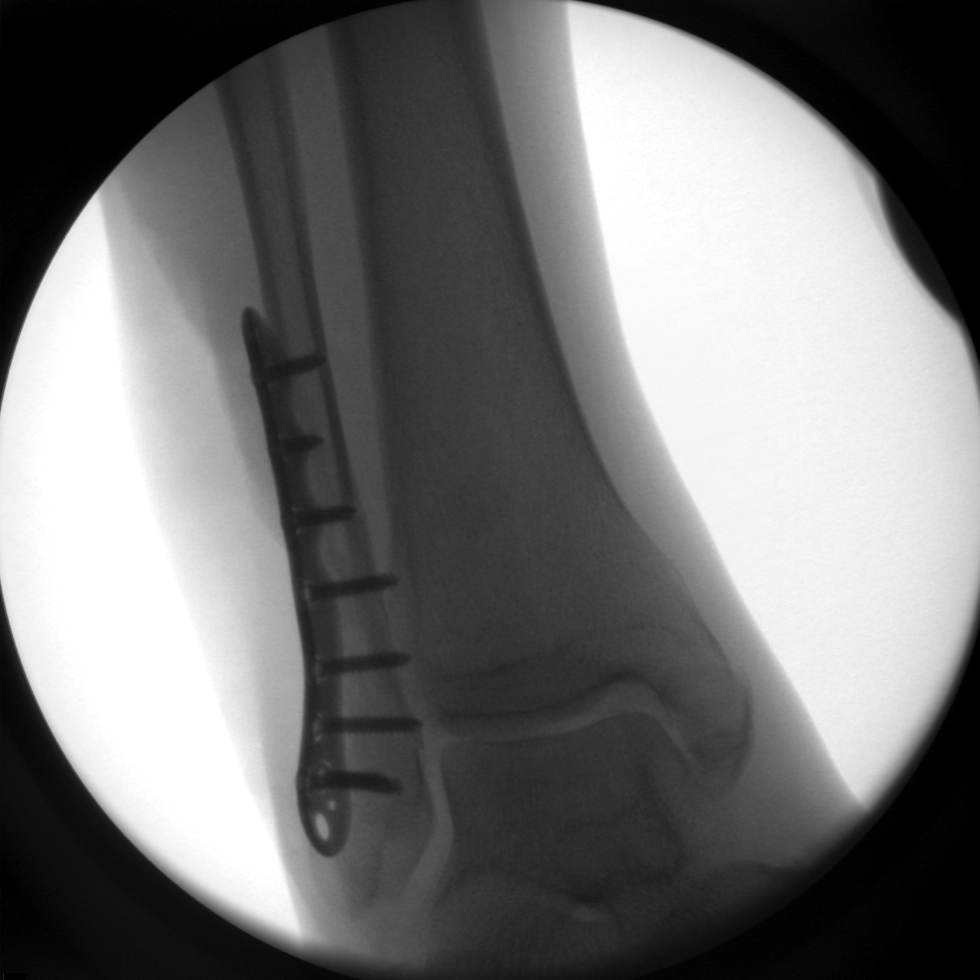
[im 3/5]
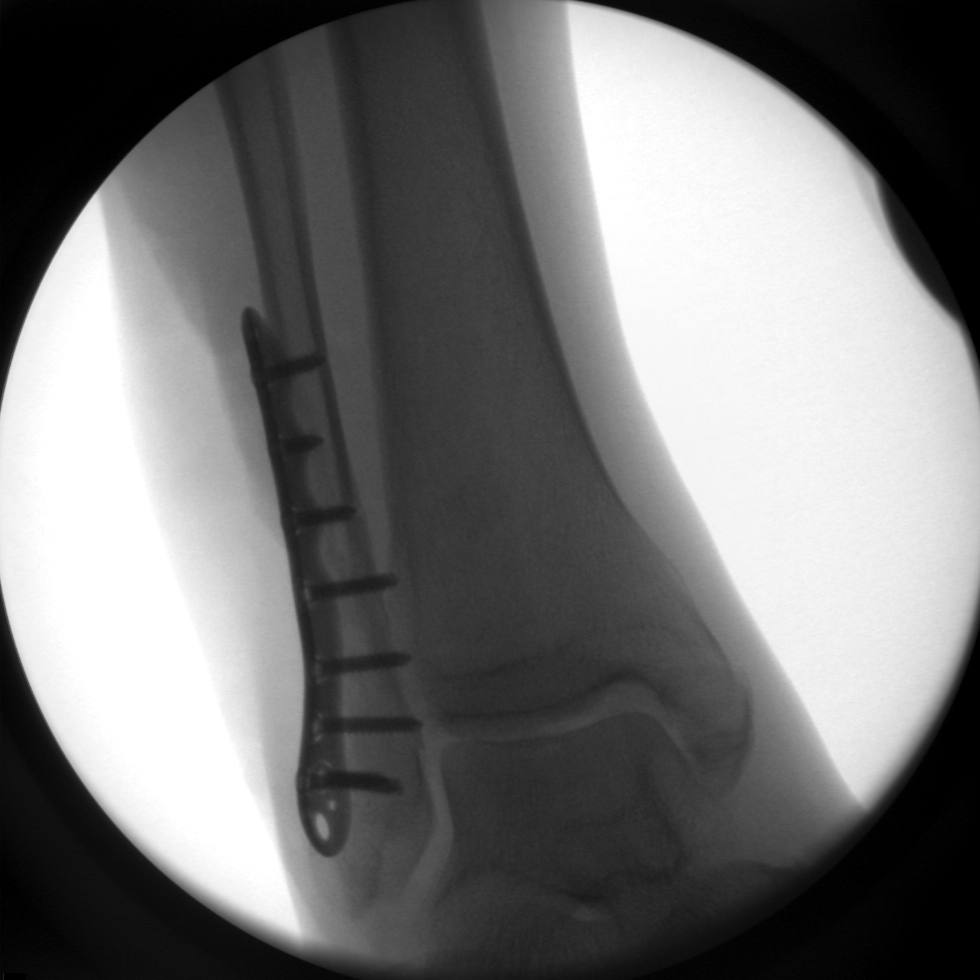
[im 4/5]
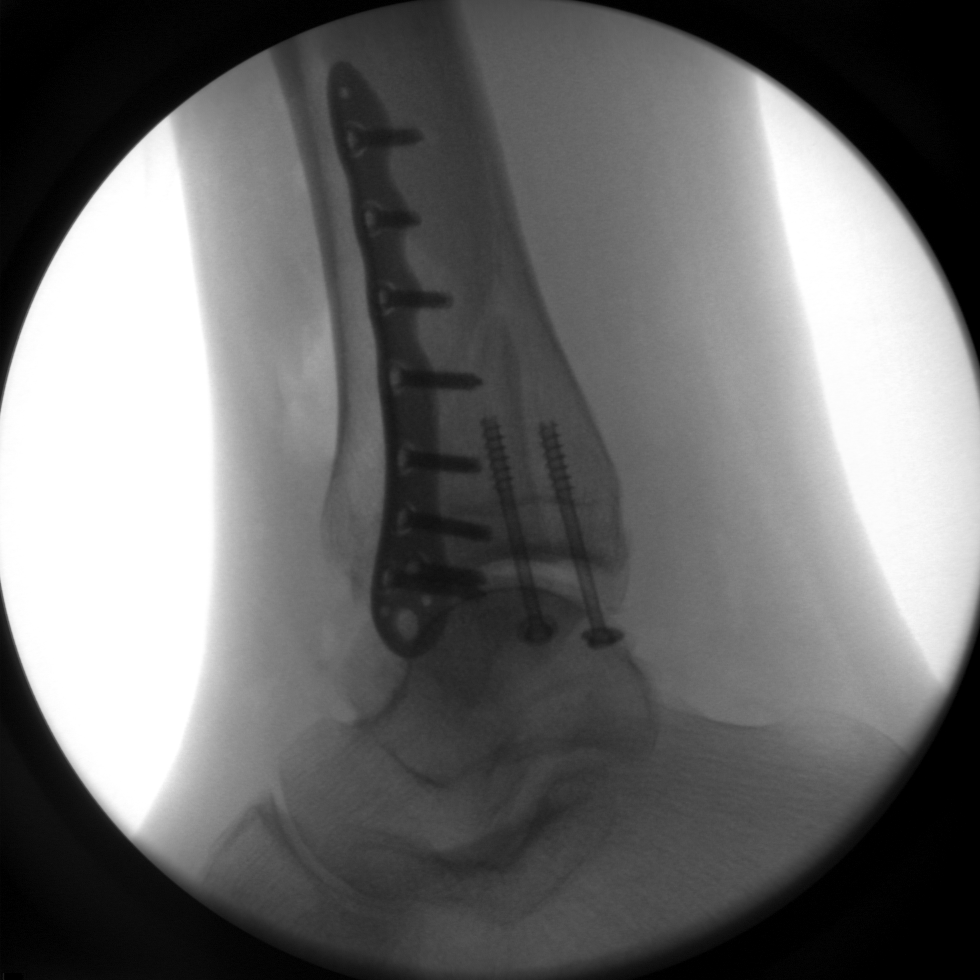
[im 5/5]
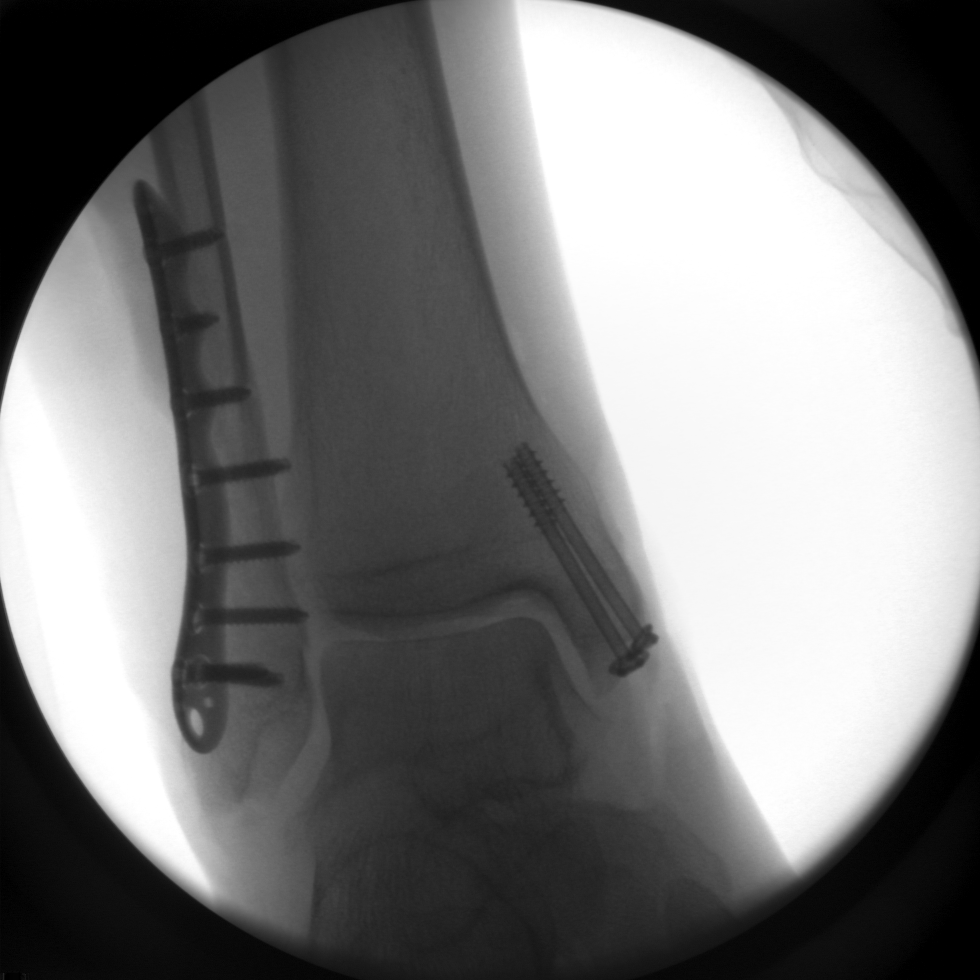

[5 of 5 positions shown; findings below may reference images not displayed]

FINDINGS: Five C-arm views of the right ankle demonstrate screw and
plate fixation of the previously demonstrated lateral malleolus
fracture and screw fixation of the medial malleolus fracture.  The
fragments are in anatomic position and alignment.  The previously
seen posterior malleolus fracture is not visible on the one lateral
view obtained.
IMPRESSION: Hardware fixation of the previously demonstrated medial and lateral
malleolus fractures with anatomic position and alignment.

## 2017-09-19 ENCOUNTER — Encounter: Payer: Self-pay | Admitting: Internal Medicine

## 2017-10-17 ENCOUNTER — Ambulatory Visit (INDEPENDENT_AMBULATORY_CARE_PROVIDER_SITE_OTHER): Payer: BC Managed Care – PPO

## 2017-10-17 DIAGNOSIS — Z1211 Encounter for screening for malignant neoplasm of colon: Secondary | ICD-10-CM

## 2017-10-17 MED ORDER — PEG 3350-KCL-NA BICARB-NACL 420 G PO SOLR: 4000.0000 mL | ORAL | 0 refills | Status: AC

## 2017-10-17 NOTE — Progress Notes (Signed)
Gastroenterology Pre-Procedure Review  Request Date:10/17/17 Requesting Physician: Dr.Fagan ( no previous tcs)  PATIENT REVIEW QUESTIONS: The patient responded to the following health history questions as indicated:    1. Diabetes Melitis: yes (yes metformin 500mg  bid) 2. Joint replacements in the past 12 months: no 3. Major health problems in the past 3 months: no 4. Has an artificial valve or MVP: no 5. Has a defibrillator: no 6. Has been advised in past to take antibiotics in advance of a procedure like teeth cleaning: no 7. Family history of colon cancer: yes (grandfather ???)  8. Alcohol Use: no 9. History of sleep apnea: no  10. History of coronary artery or other vascular stents placed within the last 12 months: no 11. History of any prior anesthesia complications: no    MEDICATIONS & ALLERGIES:    Patient reports the following regarding taking any blood thinners:   Plavix? no Aspirin? no Coumadin? no Brilinta? no Xarelto? no Eliquis? no Pradaxa? no Savaysa? no Effient? no  Patient confirms/reports the following medications:  Current Outpatient Medications  Medication Sig Dispense Refill  . allopurinol (ZYLOPRIM) 300 MG tablet Take 300 mg by mouth daily.    . metFORMIN (GLUCOPHAGE) 500 MG tablet Take 500 mg by mouth 2 (two) times daily with a meal.     No current facility-administered medications for this visit.     Patient confirms/reports the following allergies:  Allergies  Allergen Reactions  . Ciprofloxacin     No orders of the defined types were placed in this encounter.   AUTHORIZATION INFORMATION Primary Insurance: [employer] [place]  ID #: [id number] Pre-Cert / Berkley HarveyAuth required: no   SCHEDULE INFORMATION: Procedure has been scheduled as follows:  Date: 12/09/17, Time: 8:30 Location: [facility] Dr.Rourk  This Gastroenterology Pre-Precedure Review Form is being routed to the following provider(s): Tana CoastLeslie Lewis PA-C

## 2017-10-17 NOTE — Patient Instructions (Addendum)
KACIN DANCY   [birth year] MRN: [id number]    Procedure Date: 01/18/18 Time to register: 7:30 Place to register: [place] [facility] Procedure Time: 8:30 Scheduled provider: R. Garfield Cornea, MD  PREPARATION FOR COLONOSCOPY WITH TRI-LYTE SPLIT PREP  Please notify us immediately if you are diabetic, take iron supplements, or if you are on Coumadin or any other blood thinners.  Please call if you have any changes to your health or medications prior to procedure date.    You will need to purchase 1 fleet enema and 1 box of Bisacodyl 46m tablets.   2 DAYS BEFORE PROCEDURE:  DATE: 01/16/18   DAY: Monday Begin clear liquid diet AFTER your lunch meal. NO SOLID FOODS after this point.  1 DAY BEFORE PROCEDURE:  DATE: 01/17/18   DAY: Tuesday Continue clear liquids the entire day - NO SOLID FOOD.   Diabetic medications adjustments for today: only take morning dose of metformin, hold evening dose.  At 2:00 pm:  Take 2 Bisacodyl tablets.   At 4:00pm:  Start drinking your solution. Make sure you mix well per instructions on the bottle. Try to drink 1 (one) 8 ounce glass every 10-15 minutes until you have consumed HALF the jug. You should complete by 6:00pm.You must keep the left over solution refrigerated until completed next day.  Continue clear liquids. You must drink plenty of clear liquids to prevent dehyration and kidney failure. Nothing to eat or drink after midnight.  EXCEPTION: If you take medications for your heart, blood pressure or breathing, you may take these medications with a small amount of clear liquid.    DAY OF PROCEDURE:   DATE: 01/18/18   DAY: Wednesday  Diabetic medications adjustments for today: hold morning dose of metformin  Five hours before your procedure time @ 3:30am:  Finish remaining amout of bowel prep, drinking 1 (one) 8 ounce glass every 10-15 minutes until complete. You have two hours to consume remaining prep.   Three hours before your procedure time  _0 :30am:  Nothing by mouth.   At least one hour before going to the hospital:  Give yourself one Fleet enema. You may take your morning medications with sip of water unless we have instructed otherwise.      Please see below for Dietary Information.  CLEAR LIQUIDS INCLUDE:  Water Jello (NOT red in color)   Ice Popsicles (NOT red in color)   Tea (sugar ok, no milk/cream) Powdered fruit flavored drinks  Coffee (sugar ok, no milk/cream) Gatorade/ Lemonade/ Kool-Aid  (NOT red in color)   Juice: apple, white grape, white cranberry Soft drinks  Clear bullion, consomme, broth (fat free beef/chicken/vegetable)  Carbonated beverages (any kind)  Strained chicken noodle soup Hard Candy   Remember: Clear liquids are liquids that will allow you to see your fingers on the other side of a clear glass. Be sure liquids are NOT red in color, and not cloudy, but CLEAR.  DO NOT EAT OR DRINK ANY OF THE FOLLOWING:  Dairy products of any kind   Cranberry juice Tomato juice / V8 juice   Grapefruit juice Orange juice     Red grape juice  Do not eat any solid foods, including such foods as: cereal, oatmeal, yogurt, fruits, vegetables, creamed soups, eggs, bread, crackers, pureed foods in a blender, etc.   HELPFUL HINTS FOR DRINKING PREP SOLUTION:   Make sure prep is extremely cold. Mix and refrigerate the the morning of the prep. You may also put in the freezer.  You may try mixing some Crystal Light or Country Time Lemonade if you prefer. Mix in small amounts; add more if necessary.  Try drinking through a straw  Rinse mouth with water or a mouthwash between glasses, to remove after-taste.  Try sipping on a cold beverage /ice/ popsicles between glasses of prep.  Place a piece of sugar-free hard candy in mouth between glasses.  If you become nauseated, try consuming smaller amounts, or stretch out the time between glasses. Stop for 30-60 minutes, then slowly start back drinking.         OTHER INSTRUCTIONS  You will need a responsible adult at least 52 years of age to accompany you and drive you home. This person must remain in the waiting room during your procedure. The hospital will cancel your procedure if you do not have a responsible adult with you.   1. Wear loose fitting clothing that is easily removed. 2. Leave jewelry and other valuables at home.  3. Remove all body piercing jewelry and leave at home. 4. Total time from sign-in until discharge is approximately 2-3 hours. 5. You should go home directly after your procedure and rest. You can resume normal activities the day after your procedure. 6. The day of your procedure you should not:  Drive  Make legal decisions  Operate machinery  Drink alcohol  Return to work   You may call the office (Dept: [phone]) before 5:00pm, or page the doctor on call [phone]) after 5:00pm, for further instructions, if necessary.   Insurance Information YOU WILL NEED TO CHECK WITH YOUR INSURANCE COMPANY FOR THE BENEFITS OF COVERAGE YOU HAVE FOR THIS PROCEDURE.  UNFORTUNATELY, NOT ALL INSURANCE COMPANIES HAVE BENEFITS TO COVER ALL OR PART OF THESE TYPES OF PROCEDURES.  IT IS YOUR RESPONSIBILITY TO CHECK YOUR BENEFITS, HOWEVER, WE WILL BE GLAD TO ASSIST YOU WITH ANY CODES YOUR INSURANCE COMPANY MAY NEED.    PLEASE NOTE THAT MOST INSURANCE COMPANIES WILL NOT COVER A SCREENING COLONOSCOPY FOR PEOPLE UNDER THE AGE OF 50  IF YOU HAVE BCBS INSURANCE, YOU MAY HAVE BENEFITS FOR A SCREENING COLONOSCOPY BUT IF POLYPS ARE FOUND THE DIAGNOSIS WILL CHANGE AND THEN YOU MAY HAVE A DEDUCTIBLE THAT WILL NEED TO BE MET. SO PLEASE MAKE SURE YOU CHECK YOUR BENEFITS FOR A SCREENING COLONOSCOPY AS WELL AS A DIAGNOSTIC COLONOSCOPY.

## 2017-10-18 NOTE — Progress Notes (Signed)
Ok to schedule.  Day before TCS: metformin 500mg  am only. AM of TCS: hold meformin until after procedure.

## 2017-10-19 ENCOUNTER — Telehealth: Payer: Self-pay | Admitting: Internal Medicine

## 2017-10-19 NOTE — Telephone Encounter (Signed)
Pt needs to reschedule his colonoscopy. He will be out of town that day. He is scheduled with [facility]MR for 5/10. You can call his wife at [phone]

## 2017-10-21 NOTE — Telephone Encounter (Signed)
I spoke with the pts wife and changed procedure date. See triage.

## 2017-10-21 NOTE — Progress Notes (Signed)
pts wife called- pt will be out of town on original day of procedure. He has been rescheduled for 01/18/18. I have changed his instructions to new date and have included DM medication instructions. Mailed to the pt. Verified address with wife. I called Eber Jones and left her a message to change date of procedure.

## 2018-01-18 ENCOUNTER — Other Ambulatory Visit: Payer: Self-pay

## 2018-01-18 ENCOUNTER — Ambulatory Visit (HOSPITAL_COMMUNITY)
Admission: RE | Admit: 2018-01-18 | Discharge: 2018-01-18 | Disposition: A | Discharge status: Home / Self Care | Payer: BC Managed Care – PPO | Source: Ambulatory Visit | Attending: Internal Medicine | Admitting: Internal Medicine

## 2018-01-18 ENCOUNTER — Encounter (HOSPITAL_COMMUNITY)
Admission: RE | Disposition: A | Discharge status: Home / Self Care | Payer: Self-pay | Source: Ambulatory Visit | Attending: Internal Medicine

## 2018-01-18 ENCOUNTER — Encounter (HOSPITAL_COMMUNITY): Payer: Self-pay | Admitting: *Deleted

## 2018-01-18 DIAGNOSIS — M109 Gout, unspecified: Secondary | ICD-10-CM | POA: Insufficient documentation

## 2018-01-18 DIAGNOSIS — Z7984 Long term (current) use of oral hypoglycemic drugs: Secondary | ICD-10-CM | POA: Insufficient documentation

## 2018-01-18 DIAGNOSIS — Z1211 Encounter for screening for malignant neoplasm of colon: Secondary | ICD-10-CM | POA: Diagnosis not present

## 2018-01-18 DIAGNOSIS — E119 Type 2 diabetes mellitus without complications: Secondary | ICD-10-CM | POA: Insufficient documentation

## 2018-01-18 DIAGNOSIS — K5730 Diverticulosis of large intestine without perforation or abscess without bleeding: Secondary | ICD-10-CM | POA: Insufficient documentation

## 2018-01-18 HISTORY — PX: COLONOSCOPY: SHX5424

## 2018-01-18 LAB — GLUCOSE, CAPILLARY: Glucose-Capillary: 142 mg/dL — ABNORMAL HIGH (ref 65–99)

## 2018-01-18 SURGERY — COLONOSCOPY
Anesthesia: Moderate Sedation

## 2018-01-18 MED ORDER — MEPERIDINE HCL 100 MG/ML IJ SOLN
INTRAMUSCULAR | Status: DC | PRN
  Administered 2018-01-18: 25 mg via INTRAVENOUS
  Administered 2018-01-18: 50 mg via INTRAVENOUS

## 2018-01-18 MED ORDER — ONDANSETRON HCL 4 MG/2ML IJ SOLN
INTRAMUSCULAR | Status: DC | PRN
  Administered 2018-01-18: 4 mg via INTRAVENOUS

## 2018-01-18 MED ORDER — MIDAZOLAM HCL 5 MG/5ML IJ SOLN
INTRAMUSCULAR | Status: DC | PRN
  Administered 2018-01-18 (×2): 2 mg via INTRAVENOUS

## 2018-01-18 MED ORDER — ONDANSETRON HCL 4 MG/2ML IJ SOLN
INTRAMUSCULAR | Status: AC
  Filled 2018-01-18: qty 2

## 2018-01-18 MED ORDER — SODIUM CHLORIDE 0.9 % IV SOLN
INTRAVENOUS | Status: DC
  Administered 2018-01-18: 08:00:00 via INTRAVENOUS

## 2018-01-18 MED ORDER — MIDAZOLAM HCL 5 MG/5ML IJ SOLN
INTRAMUSCULAR | Status: AC
  Filled 2018-01-18: qty 10

## 2018-01-18 MED ORDER — MEPERIDINE HCL 100 MG/ML IJ SOLN
INTRAMUSCULAR | Status: AC
  Filled 2018-01-18: qty 2

## 2018-01-18 MED ORDER — STERILE WATER FOR IRRIGATION IR SOLN
Status: DC | PRN
  Administered 2018-01-18: 100 mL

## 2018-01-18 NOTE — Op Note (Signed)
[facility] Patient Name: Matthew Cook Procedure Date: 01/18/2018 7:29 AM MRN: [id number] Date of Birth: 1966/04/05 Attending MD: Gennette Pac , MD CSN: [id number] Age: 52 Admit Type: Outpatient Procedure:                Colonoscopy Indications:              Screening for colorectal malignant neoplasm Providers:                Gennette Pac, MD, Edrick Kins, RN, Dyann Ruddle Referring MD:              Medicines:                Midazolam 4 mg IV, Meperidine 75 mg IV Complications:            No immediate complications. Estimated Blood Loss:     Estimated blood loss: none. Procedure:                Pre-Anesthesia Assessment:                           - Prior to the procedure, a History and Physical                            was performed, and patient medications and                            allergies were reviewed. The patient's tolerance of                            previous anesthesia was also reviewed. The risks                            and benefits of the procedure and the sedation                            options and risks were discussed with the patient.                            All questions were answered, and informed consent                            was obtained. Prior Anticoagulants: The patient has                            taken no previous anticoagulant or antiplatelet                            agents. ASA Grade Assessment: II - A patient with                            mild systemic disease. After reviewing the risks  and benefits, the patient was deemed in                            satisfactory condition to undergo the procedure.                           After obtaining informed consent, the colonoscope                            was passed under direct vision. Throughout the                            procedure, the patient's blood pressure, pulse, and   oxygen saturations were monitored continuously. The                            EC-[id number] ([id number]) scope was introduced through                            the anus and advanced to the the cecum, identified                            by appendiceal orifice and ileocecal valve. The                            colonoscopy was performed without difficulty. The                            patient tolerated the procedure well. The quality                            of the bowel preparation was adequate. Scope In: 7:56:42 AM Scope Out: 8:09:13 AM Scope Withdrawal Time: 0 hours 9 minutes 7 seconds  Total Procedure Duration: 0 hours 12 minutes 31 seconds  Findings:      The perianal and digital rectal examinations were normal.      Scattered medium-mouthed diverticula were found in the sigmoid colon and       descending colon.      The exam was otherwise without abnormality on direct and retroflexion       views. Impression:               - Diverticulosis in the sigmoid colon and in the                            descending colon.                           - The examination was otherwise normal on direct                            and retroflexion views.                           - No specimens collected. Moderate Sedation:      Moderate (conscious) sedation was administered by the endoscopy  nurse       and supervised by the endoscopist. The following parameters were       monitored: oxygen saturation, heart rate, blood pressure, respiratory       rate, EKG, adequacy of pulmonary ventilation, and response to care.       Total physician intraservice time was 17 minutes. Recommendation:           - Patient has a contact number available for                            emergencies. The signs and symptoms of potential                            delayed complications were discussed with the                            patient. Return to normal activities tomorrow.                            Written  discharge instructions were provided to the                            patient.                           - Resume previous diet.                           - Continue present medications.                           - Await pathology results.                           - Repeat colonoscopy in 10 years for screening                            purposes.                           - Return to GI office PRN. Procedure Code(s):        --- Professional ---                           [phone], Colonoscopy, flexible; diagnostic, including                            collection of specimen(s) by brushing or washing,                            when performed (separate procedure)                           G0500, Moderate sedation services provided by the                            same physician or other qualified health care  professional performing a gastrointestinal                            endoscopic service that sedation supports,                            requiring the presence of an independent trained                            observer to assist in the monitoring of the                            patient's level of consciousness and physiological                            status; initial 15 minutes of intra-service time;                            patient age 28 years or older (additional time may                            be reported with [id number], as appropriate) Diagnosis Code(s):        --- Professional ---                           Z12.11, Encounter for screening for malignant                            neoplasm of colon                           K57.30, Diverticulosis of large intestine without                            perforation or abscess without bleeding CPT copyright 2017 American Medical Association. All rights reserved. The codes documented in this report are preliminary and upon coder review may  be revised to meet current compliance requirements. Gerrit Friendsobert  M. Suezette Lafave, MD Gennette Pacobert Michael Aidin Doane, MD 01/18/2018 8:15:34 AM This report has been signed electronically. Number of Addenda: 0

## 2018-01-18 NOTE — H&P (Signed)
 @LOGO @   Primary Care Physician:  Carylon PerchesFagan, Roy, MD Primary Gastroenterologist:  Dr. Jena Gaussourk  Pre-Procedure History & Physical: HPI:  Matthew Cook is a 52 y.o. male is here for a screening colonoscopy. No bowel symptoms. No family history colon cancer.  No prior colonoscopy.  Past Medical History:  Diagnosis Date  . Diabetes mellitus (HCC)   . Gout     Past Surgical History:  Procedure Laterality Date  . BACK SURGERY    . LAMINECTOMY    . ORIF ANKLE FRACTURE  02/06/2012   Procedure: OPEN REDUCTION INTERNAL FIXATION (ORIF) ANKLE FRACTURE;  Surgeon: Vickki HearingStanley E Harrison, MD;  Location: [facility];  Service: Orthopedics;  Laterality: Right;    Prior to Admission medications   Medication Sig Start Date End Date Taking? Authorizing Provider  allopurinol (ZYLOPRIM) 300 MG tablet Take 300 mg by mouth daily.   Yes [provider]  metFORMIN (GLUCOPHAGE) 500 MG tablet Take 500 mg by mouth 2 (two) times daily with a meal.   Yes [provider]  polyethylene glycol-electrolytes (TRILYTE) 420 g solution Take 4,000 mLs by mouth as directed. 10/17/17  Yes Tiffany KocherLewis, Leslie S, PA-C    Allergies as of 10/17/2017 - Review Complete 10/17/2017  Allergen Reaction Noted  . Ciprofloxacin  02/05/2012    Family History  Problem Relation Age of Onset  . Heart disease Unknown   . Arthritis Unknown   . Kidney disease Unknown   . Colon cancer Neg Hx     Social History   Socioeconomic History  . Marital status: Married    Spouse name: Not on file  . Number of children: Not on file  . Years of education: Not on file  . Highest education level: Not on file  Occupational History  . Not on file  Social Needs  . Financial resource strain: Not on file  . Food insecurity:    Worry: Not on file    Inability: Not on file  . Transportation needs:    Medical: Not on file    Non-medical: Not on file  Tobacco Use  . Smoking status: Never Smoker  . Smokeless tobacco: Current User    Types:  Chew  Substance and Sexual Activity  . Alcohol use: No  . Drug use: No  . Sexual activity: Yes  Lifestyle  . Physical activity:    Days per week: Not on file    Minutes per session: Not on file  . Stress: Not on file  Relationships  . Social connections:    Talks on phone: Not on file    Gets together: Not on file    Attends religious service: Not on file    Active member of club or organization: Not on file    Attends meetings of clubs or organizations: Not on file    Relationship status: Not on file  . Intimate partner violence:    Fear of current or ex partner: Not on file    Emotionally abused: Not on file    Physically abused: Not on file    Forced sexual activity: Not on file  Other Topics Concern  . Not on file  Social History Narrative  . Not on file    Review of Systems: See HPI, otherwise negative ROS  Physical Exam: BP (!) 160/92   Pulse 79   Temp 98.2 F (36.8 C)   Resp 20   Ht 6\' 3"  (1.905 m)   Wt 220 lb (99.8 kg)   SpO2  97%   BMI 27.50 kg/m  General:   Alert,  Well-developed, well-nourished, pleasant and cooperative in NAD Lungs:  Clear throughout to auscultation.   No wheezes, crackles, or rhonchi. No acute distress. Heart:  Regular rate and rhythm; no murmurs, clicks, rubs,  or gallops. Abdomen:  Soft, nontender and nondistended. No masses, hepatosplenomegaly or hernias noted. Normal bowel sounds, without guarding, and without rebound.    Impression/Plan: Matthew Cook is now here to undergo a screening colonoscopy. Average risk screening examination.  Risks, benefits, limitations, imponderables and alternatives regarding colonoscopy have been reviewed with the patient. Questions have been answered. All parties agreeable.     Notice:  This dictation was prepared with Dragon dictation along with smaller phrase technology. Any transcriptional errors that result from this process are unintentional and may not be corrected upon review.

## 2018-01-18 NOTE — Discharge Instructions (Signed)
Colonoscopy Discharge Instructions  Read the instructions outlined below and refer to this sheet in the next few weeks. These discharge instructions provide you with general information on caring for yourself after you leave the hospital. Your doctor may also give you specific instructions. While your treatment has been planned according to the most current medical practices available, unavoidable complications occasionally occur. If you have any problems or questions after discharge, call Dr. Jena Gauss at [phone]. ACTIVITY  You may resume your regular activity, but move at a slower pace for the next 24 hours.   Take frequent rest periods for the next 24 hours.   Walking will help get rid of the air and reduce the bloated feeling in your belly (abdomen).   No driving for 24 hours (because of the medicine (anesthesia) used during the test).    Do not sign any important legal documents or operate any machinery for 24 hours (because of the anesthesia used during the test).  NUTRITION  Drink plenty of fluids.   You may resume your normal diet as instructed by your doctor.   Begin with a light meal and progress to your normal diet. Heavy or fried foods are harder to digest and may make you feel sick to your stomach (nauseated).   Avoid alcoholic beverages for 24 hours or as instructed.  MEDICATIONS  You may resume your normal medications unless your doctor tells you otherwise.  WHAT YOU CAN EXPECT TODAY  Some feelings of bloating in the abdomen.   Passage of more gas than usual.   Spotting of blood in your stool or on the toilet paper.  IF YOU HAD POLYPS REMOVED DURING THE COLONOSCOPY:  No aspirin products for 7 days or as instructed.   No alcohol for 7 days or as instructed.   Eat a soft diet for the next 24 hours.  FINDING OUT THE RESULTS OF YOUR TEST Not all test results are available during your visit. If your test results are not back during the visit, make an appointment  with your caregiver to find out the results. Do not assume everything is normal if you have not heard from your caregiver or the medical facility. It is important for you to follow up on all of your test results.  SEEK IMMEDIATE MEDICAL ATTENTION IF:  You have more than a spotting of blood in your stool.   Your belly is swollen (abdominal distention).   You are nauseated or vomiting.   You have a temperature over 101.   You have abdominal pain or discomfort that is severe or gets worse throughout the day.    Diverticulosis information provided  Repeat screening colonoscopy in 10 years   PATIENT INSTRUCTIONS POST-ANESTHESIA  IMMEDIATELY FOLLOWING SURGERY:  Do not drive or operate machinery for the first twenty four hours after surgery.  Do not make any important decisions for twenty four hours after surgery or while taking narcotic pain medications or sedatives.  If you develop intractable nausea and vomiting or a severe headache please notify your doctor immediately.  FOLLOW-UP:  Please make an appointment with your surgeon as instructed. You do not need to follow up with anesthesia unless specifically instructed to do so.  WOUND CARE INSTRUCTIONS (if applicable):  Keep a dry clean dressing on the anesthesia/puncture wound site if there is drainage.  Once the wound has quit draining you may leave it open to air.  Generally you should leave the bandage intact for twenty four hours unless there is drainage.  If the epidural site drains for more than 36-48 hours please call the anesthesia department.  QUESTIONS?:  Please feel free to call your physician or the hospital operator if you have any questions, and they will be happy to assist you.        Diverticulosis Diverticulosis is a condition that develops when small pouches (diverticula) form in the wall of the large intestine (colon). The colon is where water is absorbed and stool is formed. The pouches form when the inside layer of  the colon pushes through weak spots in the outer layers of the colon. You may have a few pouches or many of them. What are the causes? The cause of this condition is not known. What increases the risk? The following factors may make you more likely to develop this condition:  Being older than age 52. Your risk for this condition increases with age. Diverticulosis is rare among people younger than age 52. By age 52, many people have it.  Eating a low-fiber diet.  Having frequent constipation.  Being overweight.  Not getting enough exercise.  Smoking.  Taking over-the-counter pain medicines, like aspirin and ibuprofen.  Having a family history of diverticulosis.  What are the signs or symptoms? In most people, there are no symptoms of this condition. If you do have symptoms, they may include:  Bloating.  Cramps in the abdomen.  Constipation or diarrhea.  Pain in the lower left side of the abdomen.  How is this diagnosed? This condition is most often diagnosed during an exam for other colon problems. Because diverticulosis usually has no symptoms, it often cannot be diagnosed independently. This condition may be diagnosed by:  Using a flexible scope to examine the colon (colonoscopy).  Taking an X-ray of the colon after dye has been put into the colon (barium enema).  Doing a CT scan.  How is this treated? You may not need treatment for this condition if you have never developed an infection related to diverticulosis. If you have had an infection before, treatment may include:  Eating a high-fiber diet. This may include eating more fruits, vegetables, and grains.  Taking a fiber supplement.  Taking a live bacteria supplement (probiotic).  Taking medicine to relax your colon.  Taking antibiotic medicines.  Follow these instructions at home:  Drink 6-8 glasses of water or more each day to prevent constipation.  Try not to strain when you have a bowel  movement.  If you have had an infection before: ? Eat more fiber as directed by your health care provider or your diet and nutrition specialist (dietitian). ? Take a fiber supplement or probiotic, if your health care provider approves.  Take over-the-counter and prescription medicines only as told by your health care provider.  If you were prescribed an antibiotic, take it as told by your health care provider. Do not stop taking the antibiotic even if you start to feel better.  Keep all follow-up visits as told by your health care provider. This is important. Contact a health care provider if:  You have pain in your abdomen.  You have bloating.  You have cramps.  You have not had a bowel movement in 3 days. Get help right away if:  Your pain gets worse.  Your bloating becomes very bad.  You have a fever or chills, and your symptoms suddenly get worse.  You vomit.  You have bowel movements that are bloody or black.  You have bleeding from your rectum. Summary  Diverticulosis is a condition that develops when small pouches (diverticula) form in the wall of the large intestine (colon).  You may have a few pouches or many of them.  This condition is most often diagnosed during an exam for other colon problems.  If you have had an infection related to diverticulosis, treatment may include increasing the fiber in your diet, taking supplements, or taking medicines. This information is not intended to replace advice given to you by your health care provider. Make sure you discuss any questions you have with your health care provider.

## 2018-01-23 ENCOUNTER — Encounter (HOSPITAL_COMMUNITY): Payer: Self-pay | Admitting: Internal Medicine

## 2019-10-30 DIAGNOSIS — Z125 Encounter for screening for malignant neoplasm of prostate: Secondary | ICD-10-CM | POA: Diagnosis not present

## 2019-10-30 DIAGNOSIS — Z79899 Other long term (current) drug therapy: Secondary | ICD-10-CM | POA: Diagnosis not present

## 2019-10-30 DIAGNOSIS — E1129 Type 2 diabetes mellitus with other diabetic kidney complication: Secondary | ICD-10-CM | POA: Diagnosis not present

## 2019-10-30 DIAGNOSIS — I10 Essential (primary) hypertension: Secondary | ICD-10-CM | POA: Diagnosis not present

## 2019-10-30 DIAGNOSIS — E785 Hyperlipidemia, unspecified: Secondary | ICD-10-CM | POA: Diagnosis not present

## 2019-11-20 DIAGNOSIS — H353121 Nonexudative age-related macular degeneration, left eye, early dry stage: Secondary | ICD-10-CM | POA: Diagnosis not present

## 2019-11-20 DIAGNOSIS — E119 Type 2 diabetes mellitus without complications: Secondary | ICD-10-CM | POA: Diagnosis not present

## 2019-11-20 DIAGNOSIS — H524 Presbyopia: Secondary | ICD-10-CM | POA: Diagnosis not present

## 2019-11-20 DIAGNOSIS — H5203 Hypermetropia, bilateral: Secondary | ICD-10-CM | POA: Diagnosis not present

## 2020-04-14 DIAGNOSIS — Z20828 Contact with and (suspected) exposure to other viral communicable diseases: Secondary | ICD-10-CM | POA: Diagnosis not present

## 2020-06-16 DIAGNOSIS — E1140 Type 2 diabetes mellitus with diabetic neuropathy, unspecified: Secondary | ICD-10-CM | POA: Diagnosis not present

## 2020-06-16 DIAGNOSIS — Z79899 Other long term (current) drug therapy: Secondary | ICD-10-CM | POA: Diagnosis not present

## 2020-06-16 DIAGNOSIS — M109 Gout, unspecified: Secondary | ICD-10-CM | POA: Diagnosis not present

## 2020-06-16 DIAGNOSIS — I10 Essential (primary) hypertension: Secondary | ICD-10-CM | POA: Diagnosis not present

## 2020-06-16 DIAGNOSIS — E785 Hyperlipidemia, unspecified: Secondary | ICD-10-CM | POA: Diagnosis not present

## 2020-06-23 DIAGNOSIS — E785 Hyperlipidemia, unspecified: Secondary | ICD-10-CM | POA: Diagnosis not present

## 2020-06-23 DIAGNOSIS — Z23 Encounter for immunization: Secondary | ICD-10-CM | POA: Diagnosis not present

## 2020-06-23 DIAGNOSIS — R7309 Other abnormal glucose: Secondary | ICD-10-CM | POA: Diagnosis not present

## 2020-06-23 DIAGNOSIS — E1122 Type 2 diabetes mellitus with diabetic chronic kidney disease: Secondary | ICD-10-CM | POA: Diagnosis not present

## 2020-09-24 DIAGNOSIS — E119 Type 2 diabetes mellitus without complications: Secondary | ICD-10-CM | POA: Diagnosis not present

## 2020-09-24 DIAGNOSIS — H353121 Nonexudative age-related macular degeneration, left eye, early dry stage: Secondary | ICD-10-CM | POA: Diagnosis not present

## 2020-10-21 DIAGNOSIS — L570 Actinic keratosis: Secondary | ICD-10-CM | POA: Diagnosis not present

## 2020-11-21 DIAGNOSIS — B351 Tinea unguium: Secondary | ICD-10-CM | POA: Diagnosis not present

## 2020-12-16 DIAGNOSIS — E1129 Type 2 diabetes mellitus with other diabetic kidney complication: Secondary | ICD-10-CM | POA: Diagnosis not present

## 2020-12-16 DIAGNOSIS — Z125 Encounter for screening for malignant neoplasm of prostate: Secondary | ICD-10-CM | POA: Diagnosis not present

## 2020-12-16 DIAGNOSIS — Z79899 Other long term (current) drug therapy: Secondary | ICD-10-CM | POA: Diagnosis not present

## 2020-12-16 DIAGNOSIS — E785 Hyperlipidemia, unspecified: Secondary | ICD-10-CM | POA: Diagnosis not present

## 2020-12-16 DIAGNOSIS — I10 Essential (primary) hypertension: Secondary | ICD-10-CM | POA: Diagnosis not present

## 2020-12-16 DIAGNOSIS — M109 Gout, unspecified: Secondary | ICD-10-CM | POA: Diagnosis not present

## 2020-12-19 DIAGNOSIS — B351 Tinea unguium: Secondary | ICD-10-CM | POA: Diagnosis not present

## 2020-12-22 DIAGNOSIS — R7309 Other abnormal glucose: Secondary | ICD-10-CM | POA: Diagnosis not present

## 2020-12-22 DIAGNOSIS — E1122 Type 2 diabetes mellitus with diabetic chronic kidney disease: Secondary | ICD-10-CM | POA: Diagnosis not present

## 2020-12-22 DIAGNOSIS — E785 Hyperlipidemia, unspecified: Secondary | ICD-10-CM | POA: Diagnosis not present

## 2021-03-05 DIAGNOSIS — L821 Other seborrheic keratosis: Secondary | ICD-10-CM | POA: Diagnosis not present

## 2021-03-05 DIAGNOSIS — D225 Melanocytic nevi of trunk: Secondary | ICD-10-CM | POA: Diagnosis not present

## 2021-03-05 DIAGNOSIS — L570 Actinic keratosis: Secondary | ICD-10-CM | POA: Diagnosis not present

## 2021-03-12 ENCOUNTER — Other Ambulatory Visit (HOSPITAL_BASED_OUTPATIENT_CLINIC_OR_DEPARTMENT_OTHER): Payer: Self-pay

## 2021-06-17 DIAGNOSIS — E1129 Type 2 diabetes mellitus with other diabetic kidney complication: Secondary | ICD-10-CM | POA: Diagnosis not present

## 2021-06-17 DIAGNOSIS — E785 Hyperlipidemia, unspecified: Secondary | ICD-10-CM | POA: Diagnosis not present

## 2021-06-17 DIAGNOSIS — M109 Gout, unspecified: Secondary | ICD-10-CM | POA: Diagnosis not present

## 2021-06-17 DIAGNOSIS — Z79899 Other long term (current) drug therapy: Secondary | ICD-10-CM | POA: Diagnosis not present

## 2021-06-24 DIAGNOSIS — Z23 Encounter for immunization: Secondary | ICD-10-CM | POA: Diagnosis not present

## 2021-06-24 DIAGNOSIS — M109 Gout, unspecified: Secondary | ICD-10-CM | POA: Diagnosis not present

## 2021-06-24 DIAGNOSIS — R7309 Other abnormal glucose: Secondary | ICD-10-CM | POA: Diagnosis not present

## 2021-06-24 DIAGNOSIS — K429 Umbilical hernia without obstruction or gangrene: Secondary | ICD-10-CM | POA: Diagnosis not present

## 2021-06-24 DIAGNOSIS — E1122 Type 2 diabetes mellitus with diabetic chronic kidney disease: Secondary | ICD-10-CM | POA: Diagnosis not present

## 2021-08-07 DIAGNOSIS — K429 Umbilical hernia without obstruction or gangrene: Secondary | ICD-10-CM | POA: Diagnosis not present

## 2021-08-08 ENCOUNTER — Other Ambulatory Visit: Payer: Self-pay | Admitting: General Surgery

## 2021-09-10 NOTE — Progress Notes (Signed)
Surgical Instructions    Your procedure is scheduled on 09/23/21.  Report to [facility] Main Entrance "A" at 6:30 A.M., then check in with the Admitting office.  Call this number if you have problems the morning of surgery:  [phone]   If you have any questions prior to your surgery date call [phone]: Open Monday-Friday 8am-4pm    Remember:  Do not eat after midnight the night before your surgery  You may drink clear liquids until 5:30am the morning of your surgery.   Clear liquids allowed are: Water, Non-Citrus Juices (without pulp), Carbonated Beverages, Clear Tea, Black Coffee ONLY (NO MILK, CREAM OR POWDERED CREAMER of any kind), and Gatorade  Patient Instructions  The night before surgery:  No food after midnight. ONLY clear liquids after midnight    The day of surgery (if you have diabetes): Drink ONE (1) 12 oz G2 given to you in your pre admission testing appointment by 5:30am the morning of surgery. Drink in one sitting. Do not sip.  This drink was given to you during your hospital  pre-op appointment visit.  Nothing else to drink after completing the  12 oz bottle of G2.         If you have questions, please contact your surgeons office.     Take these medicines the morning of surgery with A SIP OF WATER:  allopurinol (ZYLOPRIM)    As of today, STOP taking any Aspirin (unless otherwise instructed by your surgeon) Aleve, Naproxen, Ibuprofen, Motrin, Advil, Goody's, BC's, all herbal medications, fish oil, and all vitamins.  WHAT DO I DO ABOUT MY DIABETES MEDICATION?   Do not take oral diabetes medicines (pills) the morning of surgery.      THE MORNING OF SURGERY, do not take metFORMIN (GLUCOPHAGE).  The day of surgery, do not take other diabetes injectables, including Byetta (exenatide), Bydureon (exenatide ER), Victoza (liraglutide), or Trulicity (dulaglutide).  If your CBG is greater than 220 mg/dL, you may take  of your sliding scale (correction)  dose of insulin.   HOW TO MANAGE YOUR DIABETES BEFORE AND AFTER SURGERY  Why is it important to control my blood sugar before and after surgery? Improving blood sugar levels before and after surgery helps healing and can limit problems. A way of improving blood sugar control is eating a healthy diet by:  Eating less sugar and carbohydrates  Increasing activity/exercise  Talking with your doctor about reaching your blood sugar goals High blood sugars (greater than 180 mg/dL) can raise your risk of infections and slow your recovery, so you will need to focus on controlling your diabetes during the weeks before surgery. Make sure that the doctor who takes care of your diabetes knows about your planned surgery including the date and location.  How do I manage my blood sugar before surgery? Check your blood sugar at least 4 times a day, starting 2 days before surgery, to make sure that the level is not too high or low.  Check your blood sugar the morning of your surgery when you wake up and every 2 hours until you get to the Short Stay unit.  If your blood sugar is less than 70 mg/dL, you will need to treat for low blood sugar: Do not take insulin. Treat a low blood sugar (less than 70 mg/dL) with  cup of clear juice (cranberry or apple), 4 glucose tablets, OR glucose gel. Recheck blood sugar in 15 minutes after treatment (to make sure it is greater than 70  mg/dL). If your blood sugar is not greater than 70 mg/dL on recheck, call [phone] for further instructions. Report your blood sugar to the short stay nurse when you get to Short Stay.  If you are admitted to the hospital after surgery: Your blood sugar will be checked by the staff and you will probably be given insulin after surgery (instead of oral diabetes medicines) to make sure you have good blood sugar levels. The goal for blood sugar control after surgery is 80-180 mg/dL.           Do not wear jewelry or makeup Do not wear  lotions, powders, colognes, or deodorant. Men may shave face and neck. Do not bring valuables to the hospital. Do not wear nail polish, gel polish, artificial nails, or any other type of covering on natural nails (fingers and toes) If you have artificial nails or gel coating that need to be removed by a nail salon, please have this removed prior to surgery. Artificial nails or gel coating may interfere with anesthesia's ability to adequately monitor your vital signs.  [facility] is not responsible for any belongings or valuables. .   Do NOT Smoke (Tobacco/Vaping)  24 hours prior to your procedure  If you use a CPAP at night, you may bring your mask for your overnight stay.   Contacts, glasses, hearing aids, dentures or partials may not be worn into surgery, please bring cases for these belongings   For patients admitted to the hospital, discharge time will be determined by your treatment team.   Patients discharged the day of surgery will not be allowed to drive home, and someone needs to stay with them for 24 hours.  NO VISITORS WILL BE ALLOWED IN PRE-OP WHERE PATIENTS ARE PREPPED FOR SURGERY.  ONLY 1 SUPPORT PERSON MAY BE PRESENT IN THE WAITING ROOM WHILE YOU ARE IN SURGERY.  IF YOU ARE TO BE ADMITTED, ONCE YOU ARE IN YOUR ROOM YOU WILL BE ALLOWED TWO (2) VISITORS. 1 (ONE) VISITOR MAY STAY OVERNIGHT BUT MUST ARRIVE TO THE ROOM BY 8pm.  Minor children may have two parents present. Special consideration for safety and communication needs will be reviewed on a case by case basis.  Special instructions:    Oral Hygiene is also important to reduce your risk of infection.  Remember - BRUSH YOUR TEETH THE MORNING OF SURGERY WITH YOUR REGULAR TOOTHPASTE   [facility]- Preparing For Surgery  Before surgery, you can play an important role. Because skin is not sterile, your skin needs to be as free of germs as possible. You can reduce the number of germs on your skin by washing with CHG  (chlorahexidine gluconate) Soap before surgery.  CHG is an antiseptic cleaner which kills germs and bonds with the skin to continue killing germs even after washing.     Please do not use if you have an allergy to CHG or antibacterial soaps. If your skin becomes reddened/irritated stop using the CHG.  Do not shave (including legs and underarms) for at least 48 hours prior to first CHG shower. It is OK to shave your face.  Please follow these instructions carefully.     Shower the NIGHT BEFORE SURGERY and the MORNING OF SURGERY with CHG Soap.   If you chose to wash your hair, wash your hair first as usual with your normal shampoo. After you shampoo, rinse your hair and body thoroughly to remove the shampoo.  Then [employer] and genitals (private parts) with your  normal soap and rinse thoroughly to remove soap.  After that Use CHG Soap as you would any other liquid soap. You can apply CHG directly to the skin and wash gently with a scrungie or a clean washcloth.   Apply the CHG Soap to your body ONLY FROM THE NECK DOWN.  Do not use on open wounds or open sores. Avoid contact with your eyes, ears, mouth and genitals (private parts). Wash Face and genitals (private parts)  with your normal soap.   Wash thoroughly, paying special attention to the area where your surgery will be performed.  Thoroughly rinse your body with warm water from the neck down.  DO NOT shower/wash with your normal soap after using and rinsing off the CHG Soap.  Pat yourself dry with a CLEAN TOWEL.  Wear CLEAN PAJAMAS to bed the night before surgery  Place CLEAN SHEETS on your bed the night before your surgery  DO NOT SLEEP WITH PETS.   Day of Surgery: Take a shower with CHG soap. Wear Clean/Comfortable clothing the morning of surgery Do not apply any deodorants/lotions.   Remember to brush your teeth WITH YOUR REGULAR TOOTHPASTE.    COVID testing  If you are going to stay overnight or be admitted after your  procedure/surgery and require a pre-op COVID test, please follow these instructions after your COVID test   You are not required to quarantine however you are required to wear a well-fitting mask when you are out and around people not in your household.  If your mask becomes wet or soiled, replace with a new one.  Wash your hands often with soap and water for 20 seconds or clean your hands with an alcohol-based hand sanitizer that contains at least 60% alcohol.  Do not share personal items.  Notify your provider: if you are in close contact with someone who has COVID  or if you develop a fever of 100.4 or greater, sneezing, cough, sore throat, shortness of breath or body aches.    Please read over the following fact sheets that you were given.

## 2021-09-11 ENCOUNTER — Encounter (HOSPITAL_COMMUNITY): Payer: Self-pay

## 2021-09-11 ENCOUNTER — Other Ambulatory Visit: Payer: Self-pay

## 2021-09-11 ENCOUNTER — Encounter (HOSPITAL_COMMUNITY)
Admission: RE | Admit: 2021-09-11 | Discharge: 2021-09-11 | Disposition: A | Discharge status: Home / Self Care | Payer: BC Managed Care – PPO | Source: Ambulatory Visit | Attending: General Surgery | Admitting: General Surgery

## 2021-09-11 VITALS — BP 153/93 | HR 86 | Temp 97.7°F | Resp 17 | Ht 75.0 in | Wt 230.6 lb

## 2021-09-11 DIAGNOSIS — E119 Type 2 diabetes mellitus without complications: Secondary | ICD-10-CM | POA: Insufficient documentation

## 2021-09-11 DIAGNOSIS — K429 Umbilical hernia without obstruction or gangrene: Secondary | ICD-10-CM | POA: Diagnosis not present

## 2021-09-11 DIAGNOSIS — Z87442 Personal history of urinary calculi: Secondary | ICD-10-CM | POA: Diagnosis not present

## 2021-09-11 DIAGNOSIS — I10 Essential (primary) hypertension: Secondary | ICD-10-CM | POA: Insufficient documentation

## 2021-09-11 DIAGNOSIS — M109 Gout, unspecified: Secondary | ICD-10-CM | POA: Diagnosis not present

## 2021-09-11 DIAGNOSIS — Z01818 Encounter for other preprocedural examination: Secondary | ICD-10-CM

## 2021-09-11 HISTORY — DX: Personal history of urinary calculi: Z87.442

## 2021-09-11 HISTORY — DX: Essential (primary) hypertension: I10

## 2021-09-11 LAB — CBC
HCT: 49 % (ref 39.0–52.0)
Hemoglobin: 16.7 g/dL (ref 13.0–17.0)
MCH: 30.6 pg (ref 26.0–34.0)
MCHC: 34.1 g/dL (ref 30.0–36.0)
MCV: 89.7 fL (ref 80.0–100.0)
Platelets: 223 10*3/uL (ref 150–400)
RBC: 5.46 MIL/uL (ref 4.22–5.81)
RDW: 13.5 % (ref 11.5–15.5)
WBC: 10.3 10*3/uL (ref 4.0–10.5)
nRBC: 0 % (ref 0.0–0.2)

## 2021-09-11 LAB — BASIC METABOLIC PANEL
Anion gap: 8 (ref 5–15)
BUN: 14 mg/dL (ref 6–20)
CO2: 27 mmol/L (ref 22–32)
Calcium: 9.2 mg/dL (ref 8.9–10.3)
Chloride: 103 mmol/L (ref 98–111)
Creatinine, Ser: 1.13 mg/dL (ref 0.61–1.24)
GFR, Estimated: >60 mL/min (ref >60)
Glucose, Bld: 211 mg/dL — ABNORMAL HIGH (ref 70–99)
Potassium: 4.4 mmol/L (ref 3.5–5.1)
Sodium: 138 mmol/L (ref 135–145)

## 2021-09-11 LAB — HEMOGLOBIN A1C
Hgb A1c MFr Bld: 7 % — ABNORMAL HIGH (ref 4.8–5.6)
Mean Plasma Glucose: 154.2 mg/dL

## 2021-09-11 LAB — GLUCOSE, CAPILLARY: Glucose-Capillary: 192 mg/dL — ABNORMAL HIGH (ref 70–99)

## 2021-09-11 NOTE — Progress Notes (Signed)
PCP - Asencion Noble Cardiologist - denies   Chest x-ray - n/a EKG - 09/11/21   DM - Type 2 Fasting Blood Sugar - patient does not check sugars at home, per patient   Aspirin Instructions: follow your surgeon's instructions on when to stop taking ASA  ERAS Protcol - yes, G2 ordered & given   COVID TEST- n/a (ambulatory surgery)   Anesthesia review: yes, abnormal EKG  Patient denies shortness of breath, fever, cough and chest pain at PAT appointment   All instructions explained to the patient, with a verbal understanding of the material. Patient agrees to go over the instructions while at home for a better understanding. Patient also instructed to self quarantine after being tested for COVID-19. The opportunity to ask questions was provided.

## 2021-09-14 NOTE — Progress Notes (Signed)
Anesthesia Chart Review:  Case: [id number] Date/Time: 09/23/21 0815   Procedure: OPEN UMBILICAL HERNIA REPAIR WITH POSSIBLE MESH, POSSIBLE DIAGNOSTIC LAPAROSCOPY   Anesthesia type: General   Pre-op diagnosis: UMBILICAL HERNIA, PRIMARY   Location: [place] OR ROOM 02 / [place] OR   Surgeons: Rolm Bookbinder, MD       DISCUSSION: Patient is a 56 year old male scheduled for the above procedure.  History includes never smoker, HTN, DM2, nephrolithiasis, gout, spinal surgery.  Preoperative EKG showed normal sinus rhythm, nonspecific ST abnormality.  He denied chest pain or shortness of breath.  Preoperative labs acceptable for OR with A1c 7%.  He will get a CBG on arrival for surgery.  Anesthesia team to evaluate on the day of surgery.   VS: BP (!) 153/93    Pulse 86    Temp 36.5 C (Oral)    Resp 17    Ht 6\' 3"  (1.905 m)    Wt 104.6 kg    SpO2 99%    BMI 28.82 kg/m   PROVIDERS: Asencion Noble, MD is PCP    LABS: Labs reviewed: Acceptable for surgery. A1c 7.0%. He does not monitor home CBGs.  (all labs ordered are listed, but only abnormal results are displayed)  Labs Reviewed  GLUCOSE, CAPILLARY - Abnormal; Notable for the following components:      Result Value   Glucose-Capillary 192 (*)    All other components within normal limits  BASIC METABOLIC PANEL - Abnormal; Notable for the following components:   Glucose, Bld 211 (*)    All other components within normal limits  HEMOGLOBIN A1C - Abnormal; Notable for the following components:   Hgb A1c MFr Bld 7.0 (*)    All other components within normal limits  CBC    EKG: 09/11/21: Normal sinus rhythm Nonspecific ST abnormality Abnormal ECG When compared with ECG of 05-Feb-2012 21:40, PREVIOUS ECG IS PRESENT Confirmed by Croitoru, Mihai [phone]) on 09/11/2021 8:39:54 PM   CV: N/A  Past Medical History:  Diagnosis Date   Diabetes mellitus ([place])    Gout    History of kidney stones    Hypertension     Past Surgical History:   Procedure Laterality Date   BACK SURGERY     COLONOSCOPY N/A 01/18/2018   Procedure: COLONOSCOPY;  Surgeon: Daneil Dolin, MD;  Location: [facility];  Service: Endoscopy;  Laterality: N/A;  8:30   LAMINECTOMY     ORIF ANKLE FRACTURE  02/06/2012   Procedure: OPEN REDUCTION INTERNAL FIXATION (ORIF) ANKLE FRACTURE;  Surgeon: Carole Civil, MD;  Location: [facility];  Service: Orthopedics;  Laterality: Right;    MEDICATIONS:  allopurinol (ZYLOPRIM) 300 MG tablet   aspirin EC 81 MG tablet   metFORMIN (GLUCOPHAGE) 500 MG tablet   polyethylene glycol-electrolytes (TRILYTE) 420 g solution   rosuvastatin (CRESTOR) 10 MG tablet   valsartan (DIOVAN) 80 MG tablet   No current facility-administered medications for this encounter.  Advised to follow surgeon recommendations regarding perioperative ASA.   Myra Gianotti, PA-C Surgical Short Stay/Anesthesiology [facility] Phone [phone] [facility] Phone [phone] 09/14/2021 4:34 PM

## 2021-09-14 NOTE — Anesthesia Preprocedure Evaluation (Addendum)
Anesthesia Evaluation  Patient identified by MRN, date of birth, ID band Patient awake    Reviewed: Allergy & Precautions, NPO status , Patient's Chart, lab work & pertinent test results  Airway Mallampati: I  TM Distance: >3 FB Neck ROM: Full    Dental  (+) Teeth Intact, Dental Advisory Given, Caps,    Pulmonary neg pulmonary ROS,    Pulmonary exam normal breath sounds clear to auscultation       Cardiovascular hypertension, Pt. on medications Normal cardiovascular exam Rhythm:Regular Rate:Normal     Neuro/Psych negative neurological ROS  negative psych ROS   GI/Hepatic Neg liver ROS, UMBILICAL HERNIA, PRIMARY   Endo/Other  diabetes, Type 2, Oral Hypoglycemic Agents  Renal/GU negative Renal ROS     Musculoskeletal negative musculoskeletal ROS (+)   Abdominal   Peds  Hematology negative hematology ROS (+)   Anesthesia Other Findings   Reproductive/Obstetrics                           Anesthesia Physical Anesthesia Plan  ASA: 2  Anesthesia Plan: General   Post-op Pain Management: Regional block* and Tylenol PO (pre-op)*   Induction: Intravenous  PONV Risk Score and Plan: 3 and Midazolam, Dexamethasone and Ondansetron  Airway Management Planned: Oral ETT  Additional Equipment:   Intra-op Plan:   Post-operative Plan: Extubation in OR  Informed Consent: I have reviewed the patients History and Physical, chart, labs and discussed the procedure including the risks, benefits and alternatives for the proposed anesthesia with the patient or authorized representative who has indicated his/her understanding and acceptance.     Dental advisory given  Plan Discussed with: CRNA  Anesthesia Plan Comments: (PAT note written 09/14/2021 by Shonna Chock, PA-C. )      Anesthesia Quick Evaluation

## 2021-09-23 ENCOUNTER — Other Ambulatory Visit: Payer: Self-pay

## 2021-09-23 ENCOUNTER — Encounter (HOSPITAL_COMMUNITY)
Admission: RE | Disposition: A | Discharge status: Home / Self Care | Payer: Self-pay | Source: Home / Self Care | Attending: General Surgery

## 2021-09-23 ENCOUNTER — Ambulatory Visit (HOSPITAL_COMMUNITY): Payer: BC Managed Care – PPO | Admitting: Vascular Surgery

## 2021-09-23 ENCOUNTER — Ambulatory Visit (HOSPITAL_COMMUNITY): Payer: BC Managed Care – PPO | Admitting: Anesthesiology

## 2021-09-23 ENCOUNTER — Encounter (HOSPITAL_COMMUNITY): Payer: Self-pay | Admitting: General Surgery

## 2021-09-23 ENCOUNTER — Ambulatory Visit (HOSPITAL_COMMUNITY)
Admission: RE | Admit: 2021-09-23 | Discharge: 2021-09-23 | Disposition: A | Discharge status: Home / Self Care | Payer: BC Managed Care – PPO | Attending: General Surgery | Admitting: General Surgery

## 2021-09-23 DIAGNOSIS — E119 Type 2 diabetes mellitus without complications: Secondary | ICD-10-CM | POA: Diagnosis not present

## 2021-09-23 DIAGNOSIS — I10 Essential (primary) hypertension: Secondary | ICD-10-CM | POA: Insufficient documentation

## 2021-09-23 DIAGNOSIS — G8918 Other acute postprocedural pain: Secondary | ICD-10-CM | POA: Diagnosis not present

## 2021-09-23 DIAGNOSIS — K429 Umbilical hernia without obstruction or gangrene: Secondary | ICD-10-CM | POA: Insufficient documentation

## 2021-09-23 DIAGNOSIS — Z7984 Long term (current) use of oral hypoglycemic drugs: Secondary | ICD-10-CM | POA: Insufficient documentation

## 2021-09-23 HISTORY — PX: UMBILICAL HERNIA REPAIR: SHX196

## 2021-09-23 LAB — GLUCOSE, CAPILLARY
Glucose-Capillary: 176 mg/dL — ABNORMAL HIGH (ref 70–99)
Glucose-Capillary: 149 mg/dL — ABNORMAL HIGH (ref 70–99)
Glucose-Capillary: 148 mg/dL — ABNORMAL HIGH (ref 70–99)

## 2021-09-23 SURGERY — REPAIR, HERNIA, UMBILICAL, ADULT
Anesthesia: General | Site: Abdomen

## 2021-09-23 MED ORDER — EPHEDRINE SULFATE-NACL 50-0.9 MG/10ML-% IV SOSY
PREFILLED_SYRINGE | INTRAVENOUS | Status: DC | PRN
  Administered 2021-09-23: 5 mg via INTRAVENOUS

## 2021-09-23 MED ORDER — BUPIVACAINE HCL (PF) 0.5 % IJ SOLN: INTRAMUSCULAR | Status: DC | PRN

## 2021-09-23 MED ORDER — ROCURONIUM BROMIDE 10 MG/ML (PF) SYRINGE
PREFILLED_SYRINGE | INTRAVENOUS | Status: AC
  Filled 2021-09-23: qty 10

## 2021-09-23 MED ORDER — FENTANYL CITRATE (PF) 250 MCG/5ML IJ SOLN
INTRAMUSCULAR | Status: DC | PRN
  Administered 2021-09-23: 50 ug via INTRAVENOUS

## 2021-09-23 MED ORDER — TRAMADOL HCL 50 MG PO TABS
50.0000 mg | ORAL_TABLET | Freq: Four times a day (QID) | ORAL | 1 refills | Status: AC | PRN
Start: 2021-09-23 — End: ?

## 2021-09-23 MED ORDER — INSULIN ASPART 100 UNIT/ML IJ SOLN
INTRAMUSCULAR | Status: AC
  Filled 2021-09-23: qty 1

## 2021-09-23 MED ORDER — FENTANYL CITRATE (PF) 100 MCG/2ML IJ SOLN
INTRAMUSCULAR | Status: AC
  Administered 2021-09-23: 50 ug via INTRAVENOUS
  Filled 2021-09-23: qty 2

## 2021-09-23 MED ORDER — ORAL CARE MOUTH RINSE: 15.0000 mL | Freq: Once | OROMUCOSAL | Status: AC

## 2021-09-23 MED ORDER — EPHEDRINE 5 MG/ML INJ
INTRAVENOUS | Status: AC
  Filled 2021-09-23: qty 5

## 2021-09-23 MED ORDER — ENSURE PRE-SURGERY PO LIQD: 296.0000 mL | Freq: Once | ORAL | Status: DC

## 2021-09-23 MED ORDER — ONDANSETRON HCL 4 MG/2ML IJ SOLN
INTRAMUSCULAR | Status: DC | PRN
  Administered 2021-09-23: 4 mg via INTRAVENOUS

## 2021-09-23 MED ORDER — ONDANSETRON HCL 4 MG/2ML IJ SOLN
INTRAMUSCULAR | Status: AC
  Filled 2021-09-23: qty 2

## 2021-09-23 MED ORDER — CHLORHEXIDINE GLUCONATE 0.12 % MT SOLN
15.0000 mL | Freq: Once | OROMUCOSAL | Status: AC
  Administered 2021-09-23: 15 mL via OROMUCOSAL
  Filled 2021-09-23: qty 15

## 2021-09-23 MED ORDER — CHLORHEXIDINE GLUCONATE CLOTH 2 % EX PADS: 6.0000 | MEDICATED_PAD | Freq: Once | CUTANEOUS | Status: DC

## 2021-09-23 MED ORDER — PROPOFOL 10 MG/ML IV BOLUS
INTRAVENOUS | Status: DC | PRN
  Administered 2021-09-23: 130 mg via INTRAVENOUS

## 2021-09-23 MED ORDER — MIDAZOLAM HCL 2 MG/2ML IJ SOLN: 2.0000 mg | Freq: Once | INTRAMUSCULAR | Status: AC

## 2021-09-23 MED ORDER — CEFAZOLIN SODIUM-DEXTROSE 2-4 GM/100ML-% IV SOLN
2.0000 g | INTRAVENOUS | Status: AC
  Administered 2021-09-23: 2 g via INTRAVENOUS
  Filled 2021-09-23: qty 100

## 2021-09-23 MED ORDER — ROCURONIUM BROMIDE 10 MG/ML (PF) SYRINGE
PREFILLED_SYRINGE | INTRAVENOUS | Status: DC | PRN
  Administered 2021-09-23: 50 mg via INTRAVENOUS

## 2021-09-23 MED ORDER — FENTANYL CITRATE (PF) 100 MCG/2ML IJ SOLN: 50.0000 ug | Freq: Once | INTRAMUSCULAR | Status: AC

## 2021-09-23 MED ORDER — BUPIVACAINE HCL (PF) 0.5 % IJ SOLN
INTRAMUSCULAR | Status: DC | PRN
  Administered 2021-09-23 (×2): 15 mL via PERINEURAL

## 2021-09-23 MED ORDER — MIDAZOLAM HCL 2 MG/2ML IJ SOLN
INTRAMUSCULAR | Status: AC
  Administered 2021-09-23: 2 mg via INTRAVENOUS
  Filled 2021-09-23: qty 2

## 2021-09-23 MED ORDER — PHENYLEPHRINE 40 MCG/ML (10ML) SYRINGE FOR IV PUSH (FOR BLOOD PRESSURE SUPPORT)
PREFILLED_SYRINGE | INTRAVENOUS | Status: AC
  Filled 2021-09-23: qty 10

## 2021-09-23 MED ORDER — SUGAMMADEX SODIUM 200 MG/2ML IV SOLN
INTRAVENOUS | Status: DC | PRN
  Administered 2021-09-23: 200 mg via INTRAVENOUS

## 2021-09-23 MED ORDER — LIDOCAINE 2% (20 MG/ML) 5 ML SYRINGE
INTRAMUSCULAR | Status: DC | PRN
Start: 2021-09-23 — End: 2021-09-23
  Administered 2021-09-23: 80 mg via INTRAVENOUS

## 2021-09-23 MED ORDER — LACTATED RINGERS IV SOLN: INTRAVENOUS | Status: DC

## 2021-09-23 MED ORDER — 0.9 % SODIUM CHLORIDE (POUR BTL) OPTIME
TOPICAL | Status: DC | PRN
  Administered 2021-09-23: 1000 mL

## 2021-09-23 MED ORDER — BUPIVACAINE-EPINEPHRINE (PF) 0.25% -1:200000 IJ SOLN
INTRAMUSCULAR | Status: AC
  Filled 2021-09-23: qty 30

## 2021-09-23 MED ORDER — PROPOFOL 10 MG/ML IV BOLUS
INTRAVENOUS | Status: AC
  Filled 2021-09-23: qty 20

## 2021-09-23 MED ORDER — BUPIVACAINE-EPINEPHRINE 0.25% -1:200000 IJ SOLN
INTRAMUSCULAR | Status: DC | PRN
Start: 2021-09-23 — End: 2021-09-23
  Administered 2021-09-23: 4 mL

## 2021-09-23 MED ORDER — BUPIVACAINE LIPOSOME 1.3 % IJ SUSP
INTRAMUSCULAR | Status: DC | PRN
  Administered 2021-09-23 (×2): 5 mL via PERINEURAL

## 2021-09-23 MED ORDER — DEXAMETHASONE SODIUM PHOSPHATE 10 MG/ML IJ SOLN
INTRAMUSCULAR | Status: DC | PRN
  Administered 2021-09-23: 10 mg via INTRAVENOUS

## 2021-09-23 MED ORDER — FENTANYL CITRATE (PF) 250 MCG/5ML IJ SOLN
INTRAMUSCULAR | Status: AC
  Filled 2021-09-23: qty 5

## 2021-09-23 MED ORDER — PHENYLEPHRINE 40 MCG/ML (10ML) SYRINGE FOR IV PUSH (FOR BLOOD PRESSURE SUPPORT)
PREFILLED_SYRINGE | INTRAVENOUS | Status: DC | PRN
  Administered 2021-09-23: 120 ug via INTRAVENOUS
  Administered 2021-09-23: 80 ug via INTRAVENOUS
  Administered 2021-09-23: 40 ug via INTRAVENOUS

## 2021-09-23 MED ORDER — LIDOCAINE 2% (20 MG/ML) 5 ML SYRINGE
INTRAMUSCULAR | Status: AC
  Filled 2021-09-23: qty 5

## 2021-09-23 MED ORDER — MIDAZOLAM HCL 2 MG/2ML IJ SOLN
INTRAMUSCULAR | Status: DC | PRN
  Administered 2021-09-23: 2 mg via INTRAVENOUS

## 2021-09-23 MED ORDER — ACETAMINOPHEN 500 MG PO TABS
1000.0000 mg | ORAL_TABLET | ORAL | Status: AC
  Administered 2021-09-23: 1000 mg via ORAL
  Filled 2021-09-23: qty 2

## 2021-09-23 MED ORDER — INSULIN ASPART 100 UNIT/ML IJ SOLN
0.0000 [IU] | INTRAMUSCULAR | Status: DC | PRN
  Administered 2021-09-23: 2 [IU] via SUBCUTANEOUS

## 2021-09-23 MED ORDER — MIDAZOLAM HCL 2 MG/2ML IJ SOLN
INTRAMUSCULAR | Status: AC
  Filled 2021-09-23: qty 2

## 2021-09-23 SURGICAL SUPPLY — 39 items
BAG COUNTER SPONGE SURGICOUNT (BAG) ×2 IMPLANT
BLADE CLIPPER SURG (BLADE) IMPLANT
CANISTER SUCT 3000ML PPV (MISCELLANEOUS) IMPLANT
CHLORAPREP W/TINT 26 (MISCELLANEOUS) ×2 IMPLANT
COVER SURGICAL LIGHT HANDLE (MISCELLANEOUS) ×2 IMPLANT
DECANTER SPIKE VIAL GLASS SM (MISCELLANEOUS) ×2 IMPLANT
DERMABOND ADVANCED (GAUZE/BANDAGES/DRESSINGS) ×1
DERMABOND ADVANCED .7 DNX12 (GAUZE/BANDAGES/DRESSINGS) ×1 IMPLANT
DRAPE LAPAROSCOPIC ABDOMINAL (DRAPES) ×2 IMPLANT
ELECT CAUTERY BLADE 6.4 (BLADE) ×2 IMPLANT
ELECT REM PT RETURN 9FT ADLT (ELECTROSURGICAL) ×2
ELECTRODE REM PT RTRN 9FT ADLT (ELECTROSURGICAL) ×1 IMPLANT
GAUZE 4X4 16PLY ~~LOC~~+RFID DBL (SPONGE) ×2 IMPLANT
GLOVE SURG ENC MOIS LTX SZ7 (GLOVE) ×2 IMPLANT
GLOVE SURG UNDER POLY LF SZ7.5 (GLOVE) ×2 IMPLANT
GOWN STRL REUS W/ TWL LRG LVL3 (GOWN DISPOSABLE) ×2 IMPLANT
GOWN STRL REUS W/TWL LRG LVL3 (GOWN DISPOSABLE) ×4
KIT BASIN OR (CUSTOM PROCEDURE TRAY) ×2 IMPLANT
KIT TURNOVER KIT B (KITS) ×2 IMPLANT
NDL HYPO 25GX1X1/2 BEV (NEEDLE) ×1 IMPLANT
NEEDLE HYPO 25GX1X1/2 BEV (NEEDLE) ×2 IMPLANT
NS IRRIG 1000ML POUR BTL (IV SOLUTION) ×2 IMPLANT
PACK GENERAL/GYN (CUSTOM PROCEDURE TRAY) ×2 IMPLANT
PAD ARMBOARD 7.5X6 YLW CONV (MISCELLANEOUS) ×2 IMPLANT
PENCIL SMOKE EVACUATOR (MISCELLANEOUS) ×2 IMPLANT
STRIP CLOSURE SKIN 1/2X4 (GAUZE/BANDAGES/DRESSINGS) ×2 IMPLANT
SUT ETHIBOND 0 MO6 C/R (SUTURE) IMPLANT
SUT MNCRL AB 4-0 PS2 18 (SUTURE) ×2 IMPLANT
SUT NOVA 1 T20/GS 25DT (SUTURE) ×1 IMPLANT
SUT NOVA NAB GS-21 1 T12 (SUTURE) ×1 IMPLANT
SUT PROLENE 0 CT 1 CR/8 (SUTURE) IMPLANT
SUT VIC AB 2-0 CT1 27 (SUTURE) ×2
SUT VIC AB 2-0 CT1 TAPERPNT 27 (SUTURE) ×1 IMPLANT
SUT VIC AB 3-0 SH 27 (SUTURE) ×2
SUT VIC AB 3-0 SH 27X BRD (SUTURE) ×1 IMPLANT
SUT VICRYL AB 2 0 TIES (SUTURE) ×2 IMPLANT
SYR CONTROL 10ML LL (SYRINGE) ×2 IMPLANT
TOWEL GREEN STERILE (TOWEL DISPOSABLE) ×2 IMPLANT
TOWEL GREEN STERILE FF (TOWEL DISPOSABLE) ×2 IMPLANT

## 2021-09-23 NOTE — H&P (Signed)
°  56 year old male who is otherwise fairly healthy except for diabetes. He is a former and very active. He presents today with about a 1 year history of some abdominal pain related to a bulge at his umbilicus. This is remained the same over time it does bother him with activity. He does not reduce it but it has been able to be reduced by physicians in the past. He is having normal bowel function. He has had no prior abdominal surgery. He is here to discuss his options.  Review of Systems: A complete review of systems was obtained from the patient. I have reviewed this information and discussed as appropriate with the patient. See HPI as well for other ROS.  Review of Systems  Gastrointestinal: Positive for abdominal pain.  All other systems reviewed and are negative.   Medical History: Past Medical History:  Diagnosis Date   Diabetes mellitus without complication (CMS-HCC)    Past Surgical History:  Procedure Laterality Date   SPINE SURGERY    Allergies  Allergen Reactions   Ciprofloxacin Other (See Comments)  Tendons tightened up   Current Outpatient Medications on File Prior to Visit  Medication Sig Dispense Refill   allopurinoL (ZYLOPRIM) 300 MG tablet   aspirin 81 MG chewable tablet Take 81 mg by mouth once daily   metFORMIN (GLUCOPHAGE) 500 MG tablet   valsartan (DIOVAN) 80 MG tablet    Family History  Problem Relation Age of Onset   Skin cancer Mother   Hyperlipidemia (Elevated cholesterol) Mother   High blood pressure (Hypertension) Mother   High blood pressure (Hypertension) Brother   Hyperlipidemia (Elevated cholesterol) Brother   Colon cancer Maternal Grandfather    Social History   Tobacco Use  Smoking Status Never  Smokeless Tobacco Never    Social History   Socioeconomic History   Marital status: Married  Tobacco Use   Smoking status: Never   Smokeless tobacco: Never  Substance and Sexual Activity   Alcohol use: Not Currently   Drug use: Not  Currently    Body mass index is 28.62 kg/m.  Physical Exam Constitutional:  Appearance: Normal appearance.  Cardiovascular:  Rate and Rhythm: Normal rate.  Pulmonary:  Effort: Pulmonary effort is normal.  Abdominal:  Palpations: Abdomen is soft.  Tenderness: There is no abdominal tenderness.  Hernia: A hernia is present.  Comments: < 2 cm reducible mildly tender UH  Neurological:  Mental Status: He is alert.     Assessment and Plan:    Umbilical hernia without obstruction and without gangrene  Open umbilical hernia repair, possibly with mesh, possible diagnostic laparoscopy  I discussed that I do think this should be repaired given the symptoms related to it as well it is somewhat difficult to reduce at this point. 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 It is less than 2 cm and I think it is probably about 1 cm in size. Due to this I think an open anterior umbilical hernia repair is indicated. If this is very small I likely would just fix it with sutures. If this is any larger than a centimeter to a centimeter and a half I will fix it with mesh. I also discussed possibly putting the laparoscope in to ensure the mesh is in good position. We discussed risks and recovery today. I am going to plan on scheduling it.

## 2021-09-23 NOTE — Discharge Instructions (Signed)
[facility]  UMBILICAL OR INGUINAL HERNIA REPAIR: POST OP INSTRUCTIONS  Always review your discharge instruction sheet given to you by the facility where your surgery was performed. IF YOU HAVE DISABILITY OR FAMILY LEAVE FORMS, YOU MUST BRING THEM TO THE OFFICE FOR PROCESSING.   DO NOT GIVE THEM TO YOUR DOCTOR.  A  prescription for pain medication may be given to you upon discharge.  Take your pain medication as prescribed, if needed.  If narcotic pain medicine is not needed, then you may take acetaminophen (Tylenol), naprosyn (Alleve) or ibuprofen (Advil) as needed. Take your usually prescribed medications unless otherwise directed. If you need a refill on your pain medication, please contact your pharmacy.  They will contact our office to request authorization. Prescriptions will not be filled after 5 pm or on week-ends. You should follow a light diet the first 24 hours after arrival home, such as soup and crackers, etc.  Be sure to include lots of fluids daily.  Resume your normal diet the day after surgery. Most patients will experience some swelling and bruising around the umbilicus or in the groin and scrotum.  Ice packs and reclining will help.  Swelling and bruising can take several days to resolve.  It is common to experience some constipation if taking pain medication after surgery.  Increasing fluid intake and taking a stool softener (such as Colace) will usually help or prevent this problem from occurring.  A mild laxative (Milk of Magnesia or Miralax) should be taken according to package directions if there are no bowel movements after 48 hours. Unless discharge instructions indicate otherwise, you may remove your bandages 48 hours after surgery, and you may shower at that time.  You may have steri-strips (small skin tapes) in place directly over the incision.  These strips should be left on the skin for 7-10 days and will come off on their own.  If your surgeon used  skin glue on the incision, you may shower in 24 hours.  The glue will flake off over the next 2-3 weeks.  Any sutures or staples will be removed at the office during your follow-up visit. ACTIVITIES:  You may resume regular (light) daily activities beginning the next day--such as daily self-care, walking, climbing stairs--gradually increasing activities as tolerated.  You may have sexual intercourse when it is comfortable.  Refrain from any heavy lifting or straining until approved by your doctor. You may drive when you are no longer taking prescription pain medication, you can comfortably wear a seatbelt, and you can safely maneuver your car and apply brakes. RETURN TO WORK:  __________________________________________________________ You should see your doctor in the office for a follow-up appointment approximately 2-3 weeks after your surgery.  Make sure that you call for this appointment within a day or two after you arrive home to insure a convenient appointment time. OTHER INSTRUCTIONS:  __________________________________________________________________________________________________________________________________________________________________________________________  WHEN TO CALL YOUR DOCTOR: Fever over 101.0 Inability to urinate Nausea and/or vomiting Extreme swelling or bruising Continued bleeding from incision. Increased pain, redness, or drainage from the incision  The clinic staff is available to answer your questions during regular business hours.  Please don't hesitate to call and ask to speak to one of the nurses for clinical concerns.  If you have a medical emergency, go to the nearest emergency room or call 911.  A surgeon from [facility] is always on call at the hospital   [address], [address], [place], [place]    [address] ?  [address] [address], [place], [place]   [address] [phone] ? [phone] ? FAX [phone] Web site:  [email or web address]  

## 2021-09-23 NOTE — Anesthesia Procedure Notes (Signed)
Anesthesia Regional Block: TAP block   Pre-Anesthetic Checklist: , timeout performed,  Correct Patient, Correct Site, Correct Laterality,  Correct Procedure, Correct Position, site marked,  Risks and benefits discussed,  Surgical consent,  Pre-op evaluation,  At surgeon's request and post-op pain management  Laterality: Right  Prep: chloraprep       Needles:  Injection technique: Single-shot  Needle Type: Echogenic Stimulator Needle     Needle Length: 10cm  Needle Gauge: 21     Additional Needles:   Procedures:,,,, ultrasound used (permanent image in chart),,    Narrative:  Start time: 09/23/2021 7:15 AM End time: 09/23/2021 7:20 AM Injection made incrementally with aspirations every 5 mL.  Performed by: Personally   Additional Notes: No pain on injection. No increased resistance to injection. Injection made in 5cc increments.  Good needle visualization.  Patient tolerated procedure well.

## 2021-09-23 NOTE — Transfer of Care (Signed)
Immediate Anesthesia Transfer of Care Note  Patient: Matthew Cook  Procedure(s) Performed: OPEN UMBILICAL HERNIA REPAIR (Abdomen)  Patient Location: PACU  Anesthesia Type:General and Regional  Level of Consciousness: awake and alert   Airway & Oxygen Therapy: Patient Spontanous Breathing  Post-op Assessment: Report given to RN, Post -op Vital signs reviewed and stable and Patient moving all extremities X 4  Post vital signs: Reviewed and stable  Last Vitals:  Vitals Value Taken Time  BP 123/64   Temp    Pulse 81 09/23/21 0850  Resp 16 09/23/21 0850  SpO2 95 % 09/23/21 0850  Vitals shown include unvalidated device data.  Last Pain:  Vitals:   09/23/21 0650  TempSrc:   PainSc: 0-No pain         Complications: No notable events documented.

## 2021-09-23 NOTE — Anesthesia Postprocedure Evaluation (Signed)
Anesthesia Post Note  Patient: ARMOUR VILLANUEVA  Procedure(s) Performed: OPEN UMBILICAL HERNIA REPAIR (Abdomen)     Patient location during evaluation: PACU Anesthesia Type: General Level of consciousness: awake and alert Pain management: pain level controlled Vital Signs Assessment: post-procedure vital signs reviewed and stable Respiratory status: spontaneous breathing, nonlabored ventilation, respiratory function stable and patient connected to nasal cannula oxygen Cardiovascular status: blood pressure returned to baseline and stable Postop Assessment: no apparent nausea or vomiting Anesthetic complications: no   No notable events documented.  Last Vitals:  Vitals:   09/23/21 0905 09/23/21 0920  BP: 125/76 128/75  Pulse: 81 76  Resp: 13 14  Temp:  36.7 C  SpO2: 95% 96%    Last Pain:  Vitals:   09/23/21 0650  TempSrc:   PainSc: 0-No pain                 Collene Schlichter

## 2021-09-23 NOTE — Op Note (Signed)
Preoperative diagnosis: umbilical hernia Postoperative diagnosis: same as above, 1.2 cm Procedure: open umbilical hernia repair, primary Surgeon: Dr Harden Mo Anesthesia general with tap blocks Complications none Drains none Specimens none Sponge and needle count correct Dispo stable to pacu  Indications: 56 year old male who is otherwise fairly healthy except for diabetes. He is a former and very active. He presents today with about a 1 year history of some abdominal pain related to a bulge at his umbilicus. This is remained the same over time it does bother him with activity. He does not reduce it but it has been able to be reduced by physicians in the past. He is having normal bowel function. He has had no prior abdominal surgery. I discussed open repair possibly with mesh.  Procedure: After informed consent obtained he underwent bilateral TAP blocks. He was given antibiotics and SCDs were in place. He was placed under general anesthesia without complication.  He was prepped and draped in the standard sterile surgical fashion.  Surgical timeout was performed.  I infiltrated some marcaine around the umbilicus.  I made a curvilinear incision below the umbilicus and then dissected to the stalk.  I encircled this and divided it.  I then reduced the hernia which contained some preperitoneal fat.  This measured 1.2 cm after reduction. I elected to fix this without tension primarily with #1 novafil sutures.  This was intact.  I then tacked the umbilicus down with 3-0 vicryl. I closed incision with 3-0 vicryl.  I then used 4-0 monocryl, glue and steristrips.  He tolerated well and was transferred to recovery stable.

## 2021-09-23 NOTE — Anesthesia Procedure Notes (Signed)
Anesthesia Regional Block: TAP block   Pre-Anesthetic Checklist: , timeout performed,  Correct Patient, Correct Site, Correct Laterality,  Correct Procedure, Correct Position, site marked,  Risks and benefits discussed,  Surgical consent,  Pre-op evaluation,  At surgeon's request and post-op pain management  Laterality: Left  Prep: chloraprep       Needles:  Injection technique: Single-shot  Needle Type: Echogenic Stimulator Needle     Needle Length: 10cm  Needle Gauge: 21     Additional Needles:   Procedures:,,,, ultrasound used (permanent image in chart),,    Narrative:  Start time: 09/23/2021 7:10 AM End time: 09/23/2021 7:15 AM Injection made incrementally with aspirations every 5 mL.  Performed by: Personally   Additional Notes: No pain on injection. No increased resistance to injection. Injection made in 5cc increments.  Good needle visualization.  Patient tolerated procedure well.

## 2021-09-23 NOTE — Interval H&P Note (Signed)
History and Physical Interval Note:  09/23/2021 7:48 AM  Matthew Cook  has presented today for surgery, with the diagnosis of UMBILICAL HERNIA, PRIMARY.  The various methods of treatment have been discussed with the patient and family. After consideration of risks, benefits and other options for treatment, the patient has consented to  Procedure(s): OPEN UMBILICAL HERNIA REPAIR WITH POSSIBLE MESH, POSSIBLE DIAGNOSTIC LAPAROSCOPY (N/A) as a surgical intervention.  The patient's history has been reviewed, patient examined, no change in status, stable for surgery.  I have reviewed the patient's chart and labs.  Questions were answered to the patient's satisfaction.     Emelia Loron

## 2021-09-23 NOTE — Anesthesia Procedure Notes (Signed)
Procedure Name: Intubation Date/Time: 09/23/2021 8:04 AM Performed by: Nils Pyle, CRNA Pre-anesthesia Checklist: Patient identified, Emergency Drugs available, Suction available and Patient being monitored Patient Re-evaluated:Patient Re-evaluated prior to induction Oxygen Delivery Method: Circle System Utilized Preoxygenation: Pre-oxygenation with 100% oxygen Induction Type: IV induction Ventilation: Mask ventilation without difficulty Laryngoscope Size: Miller and 2 Grade View: Grade I Tube type: Oral Tube size: 7.5 mm Number of attempts: 1 Airway Equipment and Method: Stylet and Oral airway Placement Confirmation: ETT inserted through vocal cords under direct vision, positive ETCO2 and breath sounds checked- equal and bilateral Secured at: 23 cm Tube secured with: Tape Dental Injury: Teeth and Oropharynx as per pre-operative assessment

## 2021-09-24 ENCOUNTER — Encounter (HOSPITAL_COMMUNITY): Payer: Self-pay | Admitting: General Surgery

## 2021-09-29 DIAGNOSIS — H353122 Nonexudative age-related macular degeneration, left eye, intermediate dry stage: Secondary | ICD-10-CM | POA: Diagnosis not present

## 2021-09-29 DIAGNOSIS — E119 Type 2 diabetes mellitus without complications: Secondary | ICD-10-CM | POA: Diagnosis not present

## 2021-09-29 DIAGNOSIS — H5203 Hypermetropia, bilateral: Secondary | ICD-10-CM | POA: Diagnosis not present

## 2021-10-20 DIAGNOSIS — Z09 Encounter for follow-up examination after completed treatment for conditions other than malignant neoplasm: Secondary | ICD-10-CM | POA: Diagnosis not present

## 2021-10-21 DIAGNOSIS — E1129 Type 2 diabetes mellitus with other diabetic kidney complication: Secondary | ICD-10-CM | POA: Diagnosis not present

## 2021-10-28 DIAGNOSIS — E1129 Type 2 diabetes mellitus with other diabetic kidney complication: Secondary | ICD-10-CM | POA: Diagnosis not present

## 2021-10-28 DIAGNOSIS — R7309 Other abnormal glucose: Secondary | ICD-10-CM | POA: Diagnosis not present

## 2022-01-27 DIAGNOSIS — I10 Essential (primary) hypertension: Secondary | ICD-10-CM | POA: Diagnosis not present

## 2022-01-27 DIAGNOSIS — E785 Hyperlipidemia, unspecified: Secondary | ICD-10-CM | POA: Diagnosis not present

## 2022-01-27 DIAGNOSIS — E1129 Type 2 diabetes mellitus with other diabetic kidney complication: Secondary | ICD-10-CM | POA: Diagnosis not present

## 2022-01-27 DIAGNOSIS — Z79899 Other long term (current) drug therapy: Secondary | ICD-10-CM | POA: Diagnosis not present

## 2022-01-27 DIAGNOSIS — M109 Gout, unspecified: Secondary | ICD-10-CM | POA: Diagnosis not present

## 2022-01-27 DIAGNOSIS — Z125 Encounter for screening for malignant neoplasm of prostate: Secondary | ICD-10-CM | POA: Diagnosis not present

## 2022-02-03 DIAGNOSIS — R7309 Other abnormal glucose: Secondary | ICD-10-CM | POA: Diagnosis not present

## 2022-02-03 DIAGNOSIS — I10 Essential (primary) hypertension: Secondary | ICD-10-CM | POA: Diagnosis not present

## 2022-02-03 DIAGNOSIS — E1122 Type 2 diabetes mellitus with diabetic chronic kidney disease: Secondary | ICD-10-CM | POA: Diagnosis not present

## 2022-03-08 DIAGNOSIS — L570 Actinic keratosis: Secondary | ICD-10-CM | POA: Diagnosis not present

## 2022-03-08 DIAGNOSIS — D225 Melanocytic nevi of trunk: Secondary | ICD-10-CM | POA: Diagnosis not present

## 2022-05-31 DIAGNOSIS — E1129 Type 2 diabetes mellitus with other diabetic kidney complication: Secondary | ICD-10-CM | POA: Diagnosis not present

## 2022-06-07 DIAGNOSIS — R7309 Other abnormal glucose: Secondary | ICD-10-CM | POA: Diagnosis not present

## 2022-06-07 DIAGNOSIS — I7300 Raynaud's syndrome without gangrene: Secondary | ICD-10-CM | POA: Diagnosis not present

## 2022-06-07 DIAGNOSIS — E1122 Type 2 diabetes mellitus with diabetic chronic kidney disease: Secondary | ICD-10-CM | POA: Diagnosis not present

## 2022-06-07 DIAGNOSIS — Z23 Encounter for immunization: Secondary | ICD-10-CM | POA: Diagnosis not present

## 2022-09-02 DIAGNOSIS — I7300 Raynaud's syndrome without gangrene: Secondary | ICD-10-CM | POA: Diagnosis not present

## 2022-10-05 DIAGNOSIS — E119 Type 2 diabetes mellitus without complications: Secondary | ICD-10-CM | POA: Diagnosis not present

## 2022-10-05 DIAGNOSIS — H2513 Age-related nuclear cataract, bilateral: Secondary | ICD-10-CM | POA: Diagnosis not present

## 2022-10-05 DIAGNOSIS — H353122 Nonexudative age-related macular degeneration, left eye, intermediate dry stage: Secondary | ICD-10-CM | POA: Diagnosis not present

## 2022-10-05 DIAGNOSIS — H5203 Hypermetropia, bilateral: Secondary | ICD-10-CM | POA: Diagnosis not present

## 2022-11-05 DIAGNOSIS — E1129 Type 2 diabetes mellitus with other diabetic kidney complication: Secondary | ICD-10-CM | POA: Diagnosis not present

## 2022-11-10 DIAGNOSIS — R7309 Other abnormal glucose: Secondary | ICD-10-CM | POA: Diagnosis not present

## 2022-11-10 DIAGNOSIS — I7300 Raynaud's syndrome without gangrene: Secondary | ICD-10-CM | POA: Diagnosis not present

## 2022-11-10 DIAGNOSIS — E1122 Type 2 diabetes mellitus with diabetic chronic kidney disease: Secondary | ICD-10-CM | POA: Diagnosis not present

## 2023-01-10 DIAGNOSIS — R590 Localized enlarged lymph nodes: Secondary | ICD-10-CM | POA: Diagnosis not present

## 2023-03-03 DIAGNOSIS — E1129 Type 2 diabetes mellitus with other diabetic kidney complication: Secondary | ICD-10-CM | POA: Diagnosis not present

## 2023-03-03 DIAGNOSIS — I7300 Raynaud's syndrome without gangrene: Secondary | ICD-10-CM | POA: Diagnosis not present

## 2023-03-03 DIAGNOSIS — M1000 Idiopathic gout, unspecified site: Secondary | ICD-10-CM | POA: Diagnosis not present

## 2023-03-03 DIAGNOSIS — Z125 Encounter for screening for malignant neoplasm of prostate: Secondary | ICD-10-CM | POA: Diagnosis not present

## 2023-03-03 DIAGNOSIS — Z79899 Other long term (current) drug therapy: Secondary | ICD-10-CM | POA: Diagnosis not present

## 2023-03-10 DIAGNOSIS — E1122 Type 2 diabetes mellitus with diabetic chronic kidney disease: Secondary | ICD-10-CM | POA: Diagnosis not present

## 2023-03-10 DIAGNOSIS — E785 Hyperlipidemia, unspecified: Secondary | ICD-10-CM | POA: Diagnosis not present

## 2023-03-10 DIAGNOSIS — R809 Proteinuria, unspecified: Secondary | ICD-10-CM | POA: Diagnosis not present

## 2023-03-10 DIAGNOSIS — M109 Gout, unspecified: Secondary | ICD-10-CM | POA: Diagnosis not present

## 2023-03-16 DIAGNOSIS — D225 Melanocytic nevi of trunk: Secondary | ICD-10-CM | POA: Diagnosis not present

## 2023-03-16 DIAGNOSIS — D2261 Melanocytic nevi of right upper limb, including shoulder: Secondary | ICD-10-CM | POA: Diagnosis not present

## 2023-03-16 DIAGNOSIS — L570 Actinic keratosis: Secondary | ICD-10-CM | POA: Diagnosis not present

## 2023-03-16 DIAGNOSIS — D2271 Melanocytic nevi of right lower limb, including hip: Secondary | ICD-10-CM | POA: Diagnosis not present

## 2023-03-16 DIAGNOSIS — L218 Other seborrheic dermatitis: Secondary | ICD-10-CM | POA: Diagnosis not present

## 2023-07-12 DIAGNOSIS — E1129 Type 2 diabetes mellitus with other diabetic kidney complication: Secondary | ICD-10-CM | POA: Diagnosis not present

## 2023-07-12 DIAGNOSIS — R972 Elevated prostate specific antigen [PSA]: Secondary | ICD-10-CM | POA: Diagnosis not present

## 2023-07-19 DIAGNOSIS — R809 Proteinuria, unspecified: Secondary | ICD-10-CM | POA: Diagnosis not present

## 2023-07-19 DIAGNOSIS — E1122 Type 2 diabetes mellitus with diabetic chronic kidney disease: Secondary | ICD-10-CM | POA: Diagnosis not present

## 2023-07-19 DIAGNOSIS — Z23 Encounter for immunization: Secondary | ICD-10-CM | POA: Diagnosis not present

## 2023-10-07 DIAGNOSIS — H353121 Nonexudative age-related macular degeneration, left eye, early dry stage: Secondary | ICD-10-CM | POA: Diagnosis not present

## 2023-10-07 DIAGNOSIS — E119 Type 2 diabetes mellitus without complications: Secondary | ICD-10-CM | POA: Diagnosis not present

## 2023-10-07 DIAGNOSIS — H5203 Hypermetropia, bilateral: Secondary | ICD-10-CM | POA: Diagnosis not present

## 2023-11-10 DIAGNOSIS — E1129 Type 2 diabetes mellitus with other diabetic kidney complication: Secondary | ICD-10-CM | POA: Diagnosis not present

## 2023-11-17 DIAGNOSIS — R809 Proteinuria, unspecified: Secondary | ICD-10-CM | POA: Diagnosis not present

## 2023-11-17 DIAGNOSIS — E1122 Type 2 diabetes mellitus with diabetic chronic kidney disease: Secondary | ICD-10-CM | POA: Diagnosis not present

## 2024-03-15 DIAGNOSIS — R972 Elevated prostate specific antigen [PSA]: Secondary | ICD-10-CM | POA: Diagnosis not present

## 2024-03-15 DIAGNOSIS — I7300 Raynaud's syndrome without gangrene: Secondary | ICD-10-CM | POA: Diagnosis not present

## 2024-03-15 DIAGNOSIS — Z79899 Other long term (current) drug therapy: Secondary | ICD-10-CM | POA: Diagnosis not present

## 2024-03-15 DIAGNOSIS — M1000 Idiopathic gout, unspecified site: Secondary | ICD-10-CM | POA: Diagnosis not present

## 2024-03-15 DIAGNOSIS — E1129 Type 2 diabetes mellitus with other diabetic kidney complication: Secondary | ICD-10-CM | POA: Diagnosis not present

## 2024-03-22 DIAGNOSIS — D2271 Melanocytic nevi of right lower limb, including hip: Secondary | ICD-10-CM | POA: Diagnosis not present

## 2024-03-22 DIAGNOSIS — L570 Actinic keratosis: Secondary | ICD-10-CM | POA: Diagnosis not present

## 2024-03-22 DIAGNOSIS — D485 Neoplasm of uncertain behavior of skin: Secondary | ICD-10-CM | POA: Diagnosis not present

## 2024-03-22 DIAGNOSIS — L821 Other seborrheic keratosis: Secondary | ICD-10-CM | POA: Diagnosis not present

## 2024-03-22 DIAGNOSIS — D2272 Melanocytic nevi of left lower limb, including hip: Secondary | ICD-10-CM | POA: Diagnosis not present

## 2024-03-22 DIAGNOSIS — D225 Melanocytic nevi of trunk: Secondary | ICD-10-CM | POA: Diagnosis not present

## 2024-03-22 DIAGNOSIS — D2262 Melanocytic nevi of left upper limb, including shoulder: Secondary | ICD-10-CM | POA: Diagnosis not present

## 2024-03-30 DIAGNOSIS — E1122 Type 2 diabetes mellitus with diabetic chronic kidney disease: Secondary | ICD-10-CM | POA: Diagnosis not present

## 2024-03-30 DIAGNOSIS — R809 Proteinuria, unspecified: Secondary | ICD-10-CM | POA: Diagnosis not present

## 2024-05-09 ENCOUNTER — Encounter: Payer: Self-pay | Admitting: *Deleted

## 2024-05-09 NOTE — Progress Notes (Signed)
 [name]                                          MRN: [id number]   05/09/2024   The [facility] Quality Team Specialist reviewed this patient medical record for the purposes of chart review for care gap closure. The following were reviewed: chart review for care gap closure-kidney health evaluation for diabetes:eGFR  and uACR.    [facility] Quality Team

## 2024-05-10 DIAGNOSIS — D485 Neoplasm of uncertain behavior of skin: Secondary | ICD-10-CM | POA: Diagnosis not present

## 2024-05-10 DIAGNOSIS — D2272 Melanocytic nevi of left lower limb, including hip: Secondary | ICD-10-CM | POA: Diagnosis not present
